# Patient Record
Sex: Female | Born: 1995 | Race: Black or African American | Hispanic: No | Marital: Single | Smoking: Former smoker
Health system: Southern US, Community
[De-identification: ages and names within clinical notes are randomized; demographics above are authoritative.]

## PROBLEM LIST (undated history)

## (undated) ENCOUNTER — Inpatient Hospital Stay (HOSPITAL_COMMUNITY): Payer: Self-pay

## (undated) DIAGNOSIS — F419 Anxiety disorder, unspecified: Secondary | ICD-10-CM

## (undated) HISTORY — PX: WISDOM TOOTH EXTRACTION: SHX21

---
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
## 2014-03-21 ENCOUNTER — Encounter (HOSPITAL_COMMUNITY): Payer: Self-pay | Admitting: Emergency Medicine

## 2014-03-21 ENCOUNTER — Emergency Department (HOSPITAL_COMMUNITY)
Admission: EM | Admit: 2014-03-21 | Discharge: 2014-03-21 | Disposition: A | Discharge status: Home / Self Care | Payer: BC Managed Care – PPO | Attending: Emergency Medicine | Admitting: Emergency Medicine

## 2014-03-21 DIAGNOSIS — F410 Panic disorder [episodic paroxysmal anxiety] without agoraphobia: Secondary | ICD-10-CM | POA: Diagnosis present

## 2014-03-21 DIAGNOSIS — Z79899 Other long term (current) drug therapy: Secondary | ICD-10-CM | POA: Diagnosis not present

## 2014-03-21 DIAGNOSIS — R42 Dizziness and giddiness: Secondary | ICD-10-CM | POA: Insufficient documentation

## 2014-03-21 DIAGNOSIS — F17200 Nicotine dependence, unspecified, uncomplicated: Secondary | ICD-10-CM | POA: Insufficient documentation

## 2014-03-21 DIAGNOSIS — R110 Nausea: Secondary | ICD-10-CM | POA: Diagnosis not present

## 2014-03-21 DIAGNOSIS — F430 Acute stress reaction: Secondary | ICD-10-CM

## 2014-03-21 MED ORDER — ALPRAZOLAM 0.25 MG PO TABS: 0.2500 mg | ORAL_TABLET | Freq: Two times a day (BID) | ORAL | Status: DC | PRN

## 2014-03-21 NOTE — ED Notes (Signed)
Social worker at bedside.

## 2014-03-21 NOTE — Discharge Instructions (Signed)
Read the information below.  Use the prescribed medication as directed.  Please discuss all new medications with your pharmacist.  You may return to the Emergency Department at any time for worsening condition or any new symptoms that concern you.   If you develop worsening chest pain, shortness of breath, fever, you pass out, or become weak or dizzy, return to the ER for a recheck.    Panic Attacks Panic attacks are sudden, short-livedsurges of severe anxiety, fear, or discomfort. They may occur for no reason when you are relaxed, when you are anxious, or when you are sleeping. Panic attacks may occur for a number of reasons:   Healthy people occasionally have panic attacks in extreme, life-threatening situations, such as war or natural disasters. Normal anxiety is a protective mechanism of the body that helps us react to danger (fight or flight response).  Panic attacks are often seen with anxiety disorders, such as panic disorder, social anxiety disorder, generalized anxiety disorder, and phobias. Anxiety disorders cause excessive or uncontrollable anxiety. They may interfere with your relationships or other life activities.  Panic attacks are sometimes seen with other mental illnesses such as depression and posttraumatic stress disorder.  Certain medical conditions, prescription medicines, and drugs of abuse can cause panic attacks. SYMPTOMS  Panic attacks start suddenly, peak within 20 minutes, and are accompanied by four or more of the following symptoms:  Pounding heart or fast heart rate (palpitations).  Sweating.  Trembling or shaking.  Shortness of breath or feeling smothered.  Feeling choked.  Chest pain or discomfort.  Nausea or strange feeling in your stomach.  Dizziness, lightheadedness, or feeling like you will faint.  Chills or hot flushes.  Numbness or tingling in your lips or hands and feet.  Feeling that things are not real or feeling that you are not  yourself.  Fear of losing control or going crazy.  Fear of dying. Some of these symptoms can mimic serious medical conditions. For example, you may think you are having a heart attack. Although panic attacks can be very scary, they are not life threatening. DIAGNOSIS  Panic attacks are diagnosed through an assessment by your health care provider. Your health care provider will ask questions about your symptoms, such as where and when they occurred. Your health care provider will also ask about your medical history and use of alcohol and drugs, including prescription medicines. Your health care provider may order blood tests or other studies to rule out a serious medical condition. Your health care provider may refer you to a mental health professional for further evaluation. TREATMENT   Most healthy people who have one or two panic attacks in an extreme, life-threatening situation will not require treatment.  The treatment for panic attacks associated with anxiety disorders or other mental illness typically involves counseling with a mental health professional, medicine, or a combination of both. Your health care provider will help determine what treatment is best for you.  Panic attacks due to physical illness usually goes away with treatment of the illness. If prescription medicine is causing panic attacks, talk with your health care provider about stopping the medicine, decreasing the dose, or substituting another medicine.  Panic attacks due to alcohol or drug abuse goes away with abstinence. Some adults need professional help in order to stop drinking or using drugs. HOME CARE INSTRUCTIONS   Take all your medicines as prescribed.   Check with your health care provider before starting new prescription or over-the-counter medicines.  Keep  all follow up appointments with your health care provider. SEEK MEDICAL CARE IF:  You are not able to take your medicines as prescribed.  Your  symptoms do not improve or get worse. SEEK IMMEDIATE MEDICAL CARE IF:   You experience panic attack symptoms that are different than your usual symptoms.  You have serious thoughts about hurting yourself or others.  You are taking medicine for panic attacks and have a serious side effect. MAKE SURE YOU:  Understand these instructions.  Will watch your condition.  Will get help right away if you are not doing well or get worse. Document Released: 09/22/2005 Document Revised: 07/13/2013 Document Reviewed: 05/06/2013 [facility] Patient Information 2014 [place], [place].  Stress Management Stress is a state of physical or mental tension that often results from changes in your life or normal routine. Some common causes of stress are:  Death of a loved one.  Injuries or severe illnesses.  Getting fired or changing jobs.  Moving into a new home. Other causes may be:  Sexual problems.  Business or financial losses.  Taking on a large debt.  Regular conflict with someone at home or at work.  Constant tiredness from lack of sleep. It is not just bad things that are stressful. It may be stressful to:  Win the lottery.  Get married.  Buy a new car. The amount of stress that can be easily tolerated varies from person to person. Changes generally cause stress, regardless of the types of change. Too much stress can affect your health. It may lead to physical or emotional problems. Too little stress (boredom) may also become stressful. SUGGESTIONS TO REDUCE STRESS:  Talk things over with your family and friends. It often is helpful to share your concerns and worries. If you feel your problem is serious, you may want to get help from a professional counselor.  Consider your problems one at a time instead of lumping them all together. Trying to take care of everything at once may seem impossible. List all the things you need to do and then start with the most important one. Set a goal  to accomplish 2 or 3 things each day. If you expect to do too many in a single day you will naturally fail, causing you to feel even more stressed.  Do not use alcohol or drugs to relieve stress. Although you may feel better for a short time, they do not remove the problems that caused the stress. They can also be habit forming.  Exercise regularly - at least 3 times per week. Physical exercise can help to relieve that "uptight" feeling and will relax you.  The shortest distance between despair and hope is often a good night's sleep.  Go to bed and get up on time allowing yourself time for appointments without being rushed.  Take a short "time-out" period from any stressful situation that occurs during the day. Close your eyes and take some deep breaths. Starting with the muscles in your face, tense them, hold it for a few seconds, then relax. Repeat this with the muscles in your neck, shoulders, hand, stomach, back and legs.  Take good care of yourself. Eat a balanced diet and get plenty of rest.  Schedule time for having fun. Take a break from your daily routine to relax. HOME CARE INSTRUCTIONS   Call if you feel overwhelmed by your problems and feel you can no longer manage them on your own.  Return immediately if you feel like hurting yourself  or someone else. Document Released: 03/18/2001 Document Revised: 12/15/2011 Document Reviewed: 05/17/2013 [facility] Patient Information 2014 [place], [place].   Emergency Department Resource Guide 1) Find a Doctor and Pay Out of Pocket Although you won't have to find out who is covered by your insurance plan, it is a good idea to ask around and get recommendations. You will then need to call the office and see if the doctor you have chosen will accept you as a new patient and what types of options they offer for patients who are self-pay. Some doctors offer discounts or will set up payment plans for their patients who do not have insurance, but you  will need to ask so you aren't surprised when you get to your appointment.  2) Contact Your Local Health Department Not all health departments have doctors that can see patients for sick visits, but many do, so it is worth a call to see if yours does. If you don't know where your local health department is, you can check in your phone book. The CDC also has a tool to help you locate your state's health department, and many state websites also have listings of all of their local health departments.  3) Find a Walk-in Clinic If your illness is not likely to be very severe or complicated, you may want to try a walk in clinic. These are popping up all over the country in pharmacies, drugstores, and shopping centers. They're usually staffed by nurse practitioners or physician assistants that have been trained to treat common illnesses and complaints. They're usually fairly quick and inexpensive. However, if you have serious medical issues or chronic medical problems, these are probably not your best option.  No Primary Care Doctor: - Call Health Connect at  [phone] - they can help you locate a primary care doctor that  accepts your insurance, provides certain services, etc. - Physician Referral Service- [phone]  Chronic Pain Problems: Organization         Address  Phone   Notes  [facility]  [phone] Patients need to be referred by their primary care doctor.   Medication Assistance: Organization         Address  Phone   Notes  [facility] Medication [facility] [address] [place] [address] [place], [place] [address] [phone] --Must be a resident of [facility] -- Must have NO insurance coverage whatsoever (no Medicaid/ Medicare, etc.) -- The pt. MUST have a primary care doctor that directs their care regularly and follows them in the community   MedAssist  [phone]   [employer]  [phone]    Agencies that provide inexpensive medical  care: Organization         Address  Phone   Notes  [facility]  [phone]   [facility]    [phone]   [facility] [address] [place], [place] [address] [phone]   [facility] [address], [place] [phone]   Planned Parenthood    [phone]   [facility]    [phone]   [facility]  [address], [place] Phone:  [phone], Fax:  [phone] Hours of Operation:  9 am - 6 pm, M-F.  Also accepts Medicaid/Medicare and self-pay.  [facility]  [address], [place] Phone: [phone], Fax: [phone].  Hours of Operation:  8:30 am - 5:30 pm, M-F.  Also accepts Medicaid and self-pay.  [facility] [place] [address], [place] Phone: [phone]   [facility] [address] Natasha Bence [place], [place] [phone], Ext. [phone] Mondays & Thursdays: 7-9 AM.  First 15 patients are seen on a first come, first serve basis.    Medicaid-accepting [facility] Providers:  Organization         Address  Phone   Notes  [facility] [address], [place] [phone] Also accepts self-pay patients.  [facility] [address] Laurell Josephs Basir [place], [place]  [phone]   [facility] [address], [place] [phone]   [facility] Family [facility] [address], [place] [phone]   Renaye Rakers [address], [place]   [phone] Only accepts [place] patients after they have their name applied to their card.   Self-Pay (no insurance) in [facility]:  Organization         Address  Phone   Notes  Sickle Cell Patients, [facility] Internal Medicine [address] [place], [place] [phone]   [facility] Urgent Care [address] [place],  [place] [phone]   [facility] [place]  [address], [place] [phone]   [facility]/Dr. Osei-Bonsu  [address], [place] or [address], [place] [phone] Phone number for both [place] and [place] locations is the same.  [facility] [address], [place] [phone]   [facility] [address] [place] or [address] 989 [phone]   [facility] [address] [place] [phone], phone; [phone], fax Sees patients 1st and 3rd Saturday of every month.  Must not qualify for public or private insurance (i.e. Medicaid, Medicare, [place] Health Choice, Veterans' Benefits)  Household income should be no more than 200% of the poverty level The clinic cannot treat you if you are pregnant or think you are pregnant  Sexually transmitted diseases are not treated at the clinic.    Dental Care: Organization         Address  Phone  Notes  [facility] Department of [facility] [address] [place], [place] [phone] Accepts children up to age 69 who are enrolled in [place] or [place] Health Choice; pregnant women with a Medicaid card; and children who have applied for Medicaid or [place] Health Choice, but were declined, whose parents can pay a reduced fee at time of service.  [facility] Department of [facility]  [address], [place] [phone] Accepts children up to age 41 who are enrolled in [place] or [place] Health Choice; pregnant women with a Medicaid card; and children who have applied for Medicaid or  Health Choice, but were declined, whose parents can pay a reduced fee at time of service.  [facility]  [address] [place], [place] [phone] Patients are seen by appointment only. Walk-ins are not accepted. [facility] will see patients 50 years of age and older. Monday - Tuesday (8am-5pm) Most Wednesdays (8:30-5pm) $30 per visit, cash only  [facility] Adult [employer] PROGRAM  [address], [facility] [phone] Patients  are seen by appointment only. Walk-ins are not accepted. [facility] will see patients 13 years of age and older. One Wednesday Evening (Monthly: Volunteer Based).  $30 per visit, cash only  [employer]  [phone] for adults; Children under age 92, call Graduate Pediatric Dentistry at [phone]. Children aged 66-14, please call [phone] to request a pediatric application.  Dental services are provided in all areas of dental care including fillings, crowns and bridges, complete and partial dentures, implants, gum treatment, root canals, and extractions. Preventive care is also provided. Treatment is provided to both adults and children. Patients are selected via a lottery and there is often a waiting list.   [facility] [address], [place]  [phone] [email or web address]   [facility] [address] [place], [place] [phone], Ext. [phone] Second and Fourth Thursday of each month, opens at 6:30 AM; Clinic ends at 9 AM.  Patients are seen on a first-come first-served basis, and a limited number are seen during each clinic.   [facility]  [address] Ether Griffins [place], [place] [phone]   Eligibility Requirements You must have lived in [place], [place], or [place] counties for at least the last three months.   You cannot be eligible for state or federal sponsored [place], including [employer], [place], or [employer].   You generally cannot be eligible for healthcare insurance through your employer.    How to apply: Eligibility screenings are held every Tuesday and Wednesday afternoon from 1:00 pm until 4:00 pm. You do not need an appointment for the interview!   [facility] [address], [place], [place] [phone]   [facility] Health Department  [phone]   [facility] Health Department  [phone]   [facility] Health Department  [phone]    Behavioral Health Resources in the Community: Intensive Outpatient Programs Organization         Address  Phone  Notes  [facility] [address] [place], [place] [phone]   [facility] Outpatient [address], [place], [place] [phone]   [facility] [address], [place], [place]  [phone]   [facility] Mental Health [address],  [place], [place] [phone] or [phone]   Substance Abuse Resources Organization         Address  Phone  Notes  [facility]  [phone]   [facility]  [phone]   [facility] [place]  [phone]   Floydene Flock  [phone]   Residential & Outpatient Substance Abuse Program  [phone]   Psychological Services Organization         Address  Phone  Notes  [facility] Behavioral Health  [phone]   [facility]  [phone]   [facility] Mental Health [address] [place] [phone] or [phone]    Mobile Crisis Teams Organization         Address  Phone  Notes  [facility], Mobile Crisis Care Unit  [phone]   [facility]  [address] [place], [place] [phone]   Doristine Locks [address] [place] [phone]    Self-Help/Support Groups Organization         Address  Phone             Notes  [facility] [place] - variety of support groups  [phone]- I7437963 Call for  more information  Narcotics Anonymous (NA), [facility] [address], [place]  2 meetings at this location   Residential Sports administratorTreatment Programs Organization         Address  Phone  Notes  [facility] [address],    [place]  [phone]   [facility]  [address], [place] [phone], [place], [place] [phone]   [facility] [address], [place] [phone] Admissions: 8am-3pm M-F  [facility] [address]    [place], [place] [phone]   [facility] [address], [place] [phone]   [facility] [address]  [place], [place] [phone]   [facility] - Intensive Outpatient [address], [place], [place] [phone]   [facility] ([facility] [address]  [place], [place] [phone] or [phone]   [facility] ([facility]) [address] [place], [place] [phone] Accepts Medicaid  [facility] [address]  [place] [phone] Substance Abuse/Addiction Treatment   [facility] Behavioral Health Resources Organization         Address  Phone  Notes  [facility]  [phone]) [phone]   Angie FavaJulie Brannon, PhD [address] [place], [place]   [phone](336) [phone] or [phone]) [phone]   [facility]   [address] [place], [place] [phone]   [facility] [address], [place], [place] [phone] Insurance/Medicaid/sponsorship through [facility]  [facility] [address]                                    [place], [place] [phone] Therapy/tele-psych/case  [facility] [address].   [place], [place] [phone]    Dr. Lolly MustacheArfeen  [phone](336) [phone]   [facility]  [employer] [facility] 1) [address], [place] 2) [address], [place] 3)  [address], [place] [phone](336) [phone]  [phone]   [facility] Child Abuse Hotline [phone](336) [phone] or [phone](336) [phone] (After Hours)

## 2014-03-21 NOTE — Progress Notes (Signed)
CSW consult to pt regarding anxiety attack. Pt reported that this is the second attack she has experienced in the last 2 months.Pt reported that she has experienced panic attacks due to overwhelming circumstances in her life. Pt reported that her brother was killed 3 years ago in a violent attack and pt's friend was killed 2 days ago in a violent manner. Pt reported that she has been in grief counseling in connection with the death of her brother but felt it was not helpful. CSW shared with pt the stages of grief and shared coping mechanisms to combat anxiety. Pt voiced understanding. Pt agreed that she needs counseling to work through grief and anxiety. CSW gave pt resources for outpt counseling. Pt given resource for Mobile Crisis Unit.  Pt appreciative of CSW interaction. No further intervention needed. Pt to be discharged.   [address]Doris Best, [place] [phone]

## 2014-03-21 NOTE — ED Provider Notes (Signed)
CSN: [id number]     Arrival date & time 03/21/14  1157 History   First MD Initiated Contact with Patient 03/21/14 1211     Chief Complaint  Patient presents with  . Panic Attack     (Consider location/radiation/quality/duration/timing/severity/associated sxs/prior Treatment) The history is provided by the patient.    Patient presents with episode with anxiety, tachypnea, fingers tingling, chest tightness, lightheadedness, nausea, SOB that occurred while having a fight with her mother.  She was very upset this morning, thinking about her brother who was killed 3 years ago and her friend who was killed 2 days ago.  She has never been able to talk with her mother about her brother's death and feels that it is important to her.  She occasionally has episodes similar to this during which she gets very short of breath during or after a stressful conversation or argument.  She has also awoken from sleep feeling SOB.  This lasts 10-15 minutes.   States she currently feels fine.  The symptoms improved with getting away from her mother Verdia Kuba/removing herself from the emotional situation, are worsened when involved in or thinking about these emotional situations.  Has seen a counselor following her brother's death but she feels it did not help.  She does have a pediatrician she can see and feels she can talk to.  Also has a close friend who is a support person for her.   Denies recent immobilization, personal or family hx blood clots, leg swelling.   She is on birth control.    History reviewed. No pertinent past medical history. History reviewed. No pertinent past surgical history. Family History  Problem Relation Age of Onset  . Hypertension Mother   . Hypertension Father   . Diabetes Paternal Grandmother    History  Substance Use Topics  . Smoking status: Current Every Day Smoker -- 0.10 packs/day for 1 years    Types: Cigars  . Smokeless tobacco: Not on file  . Alcohol Use: No   OB History   Grav Para Term Preterm Abortions TAB SAB Ect Mult Living                 Review of Systems  All other systems reviewed and are negative.     Allergies  Review of patient's allergies indicates no known allergies.  Home Medications   Prior to Admission medications   Medication Sig Start Date End Date Taking? Authorizing Provider  loratadine (CLARITIN) 10 MG tablet Take 10 mg by mouth daily as needed for allergies.    Yes Historical Provider, MD  norgestimate-ethinyl estradiol (ORTHO-CYCLEN,SPRINTEC,PREVIFEM) 0.25-35 MG-MCG tablet Take 1 tablet by mouth daily.   Yes Historical Provider, MD  triamcinolone cream (KENALOG) 0.1 % Apply 1 application topically daily as needed (for eczema).   Yes Historical Provider, MD   BP 111/76  Pulse 58  Temp(Src) 98 F (36.7 C) (Oral)  Resp 18  Ht 5\' 3"  (1.6 m)  Wt 110 lb (49.896 kg)  BMI 19.49 kg/m2  SpO2 100% Physical Exam  Nursing note and vitals reviewed. Constitutional: She appears well-developed and well-nourished. No distress.  HENT:  Head: Normocephalic and atraumatic.  Neck: Neck supple.  Cardiovascular: Normal rate and regular rhythm.   Pulmonary/Chest: Effort normal and breath sounds normal. No respiratory distress. She has no wheezes. She has no rales.  Abdominal: Soft. She exhibits no distension. There is no tenderness. There is no rebound and no guarding.  Musculoskeletal: She exhibits no edema.  Neurological: She  is alert.  Skin: She is not diaphoretic.    ED Course  Procedures (including critical care time) Labs Review Labs Reviewed - No data to display  Imaging Review No results found.   EKG Interpretation None      12:31 PM Pt seen and examined.  Declined anxiety medication at this time.  Declined social work involvement.    1:25 PM Pt seen by Tyler Aasoris, Child psychotherapistsocial worker, who will arrange counseling for the patient.   MDM   Final diagnoses:  Panic attack as reaction to stress    Pt with constellation of  symptoms that occurred while fighting with her mother and being upset about recent family and friend deaths c/w panic attack.  Pt feels better now as she is removed from the situation.  This has happened to her in the past.  She is on birth control but otherwise has no risk factors for PE and clinically I doubt PE.  Seen by social worker who has arranged counseling follow up for the patient.  Pt d/c home with #10 low dose (0.25mg ) xanax and recommended close counselor and pediatrician follow up.  Discussed findings, treatment, and follow up  with patient.  Pt given return precautions.  Pt verbalizes understanding and agrees with plan.        St. RegisEmily Deah Ottaway, PA-C 03/21/14 [phone]

## 2014-03-21 NOTE — ED Notes (Signed)
Patient at home thinking of brother who passed away 3 year ago and a friend 2 days ago.  Argued with Mother and started to cry.  After crying breathing fast and couldn't catch her breath. During transport patient breathing improved and stopped crying.  Upon arrival patient alert answering and following commands appropriate. Calm cooperative.

## 2014-03-22 NOTE — ED Provider Notes (Signed)
Medical screening examination/treatment/procedure(s) were performed by non-physician practitioner and as supervising physician I was immediately available for consultation/collaboration.   EKG Interpretation None        Nolin Grell T Helaman Mecca, MD 03/22/14 1107 

## 2014-06-14 ENCOUNTER — Other Ambulatory Visit (INDEPENDENT_AMBULATORY_CARE_PROVIDER_SITE_OTHER): Payer: BC Managed Care – PPO | Admitting: *Deleted

## 2014-06-14 VITALS — BP 117/80 | HR 61 | Temp 98.3°F | Wt 108.8 lb

## 2014-06-14 DIAGNOSIS — Z3200 Encounter for pregnancy test, result unknown: Secondary | ICD-10-CM

## 2014-06-14 DIAGNOSIS — Z3202 Encounter for pregnancy test, result negative: Secondary | ICD-10-CM

## 2014-06-14 LAB — POCT URINE PREGNANCY: PREG TEST UR: NEGATIVE

## 2014-06-14 NOTE — Progress Notes (Signed)
Patient is in the office today for a UPT. UTP preformed, results were negative. Patient states she is needing to see a Librarian, academic. Patient notified that no Doctors are in the office this afternoon, that she had only been scheduled for a nurse visit. Patient states she had sexual intercourse 3 days ago and feels like he might have ripped her. Patient states she is having irritation, vaginal lip swelling or a lump. Patient states swelling has went down since yesterday. Patient denies any burning, vaginal discharge or odor. Patient notified to make an appointment with the front to see Dr. Tamela Oddi tomorrow morning to be examined. Patient voiced understanding. Patient states she is currently taking all medications except her xanax which she has run out of. Patient states she needs a refill because she had a panic attack yesterday. Patient states she did not receive the medication from [place].   BP 117/80  Pulse 61  Temp(Src) 98.3 F (36.8 C)  Wt 108 lb 12.8 oz (49.351 kg)  LMP 05/30/2014  Results for orders placed in visit on 06/14/14 (from the past 24 hour(s))  POCT URINE PREGNANCY     Status: None   Collection Time    06/14/14  4:23 PM      Result Value Ref Range   Preg Test, Ur Negative

## 2014-06-15 ENCOUNTER — Ambulatory Visit: Payer: BC Managed Care – PPO | Admitting: Obstetrics & Gynecology

## 2014-06-21 ENCOUNTER — Ambulatory Visit: Payer: BC Managed Care – PPO | Admitting: Obstetrics & Gynecology

## 2014-07-06 ENCOUNTER — Other Ambulatory Visit (INDEPENDENT_AMBULATORY_CARE_PROVIDER_SITE_OTHER): Payer: BC Managed Care – PPO

## 2014-07-06 VITALS — BP 129/85 | HR 101 | Temp 97.7°F | Ht 63.0 in | Wt 106.0 lb

## 2014-07-06 DIAGNOSIS — Z3200 Encounter for pregnancy test, result unknown: Secondary | ICD-10-CM

## 2014-07-06 LAB — POCT URINE PREGNANCY: PREG TEST UR: POSITIVE

## 2014-07-06 NOTE — Progress Notes (Signed)
Patient in office today for a pregnancy test. Patient states she had 3 positive home pregnancy test. Patient states she has currently been on birth control and denies missing any pills. Pregnancy Test in office is positive. Patient states this is unintended pregnancy but she does intend to keep the pregnancy. Patient encouraged to schedule a NOB appointment. Patient encouraged to start prenatal vitamins.   BP 129/85  Pulse 101  Temp(Src) 97.7 F (36.5 C)  Ht 5\' 3"  (1.6 m)  Wt 106 lb (48.081 kg)  BMI 18.78 kg/m2  LMP 05/30/2014

## 2014-07-08 ENCOUNTER — Encounter (HOSPITAL_COMMUNITY): Payer: Self-pay

## 2014-07-08 ENCOUNTER — Inpatient Hospital Stay (HOSPITAL_COMMUNITY)
Admission: AD | Admit: 2014-07-08 | Discharge: 2014-07-08 | Disposition: A | Discharge status: Home / Self Care | Payer: BC Managed Care – PPO | Source: Ambulatory Visit | Attending: Obstetrics & Gynecology | Admitting: Obstetrics & Gynecology

## 2014-07-08 DIAGNOSIS — Z87891 Personal history of nicotine dependence: Secondary | ICD-10-CM | POA: Diagnosis not present

## 2014-07-08 DIAGNOSIS — R109 Unspecified abdominal pain: Secondary | ICD-10-CM | POA: Diagnosis present

## 2014-07-08 DIAGNOSIS — O26899 Other specified pregnancy related conditions, unspecified trimester: Secondary | ICD-10-CM

## 2014-07-08 DIAGNOSIS — O9989 Other specified diseases and conditions complicating pregnancy, childbirth and the puerperium: Secondary | ICD-10-CM

## 2014-07-08 LAB — URINALYSIS, ROUTINE W REFLEX MICROSCOPIC
Glucose, UA: NEGATIVE mg/dL
Hgb urine dipstick: NEGATIVE
KETONES UR: NEGATIVE mg/dL
Leukocytes, UA: NEGATIVE
NITRITE: NEGATIVE
Protein, ur: 100 mg/dL — AB
UROBILINOGEN UA: 4 mg/dL — AB (ref 0.0–1.0)
pH: 6 (ref 5.0–8.0)

## 2014-07-08 LAB — URINE MICROSCOPIC-ADD ON

## 2014-07-08 MED ORDER — PRENATAL PLUS 27-1 MG PO TABS: 1.0000 | ORAL_TABLET | Freq: Every day | ORAL | Status: DC

## 2014-07-08 NOTE — MAU Provider Note (Signed)
History     CSN: [id number]  Arrival date and time: 07/08/14 1127   First Provider Initiated Contact with Patient 07/08/14 1227      Chief Complaint  Patient presents with  . Abdominal Pain   HPI Suzanne Goodwin 18 y.o. [redacted]w[redacted]d  Comes to MAU with upper abdominal pain today when she awakened, but is now gone.  Drove by the office this morning, but the office was closed, so she came to MAU.  Is very worried she might have a miscarriage.  Is not having lower abdominal pain or cramping.  Has not yet eaten today.  Has an appointment in the office in early November to begin prenatal care.  Significant history of panic attacks from previous medical visits noted in her chart.  OB History   Grav Para Term Preterm Abortions TAB SAB Ect Mult Living   1 0 0 0 0 0 0 0 0 0       Past Medical History  Diagnosis Date  . Medical history non-contributory     Past Surgical History  Procedure Laterality Date  . No past surgeries      Family History  Problem Relation Age of Onset  . Hypertension Mother   . Hypertension Father   . Diabetes Paternal Grandmother     History  Substance Use Topics  . Smoking status: Former Smoker -- 0.10 packs/day for 1 years    Types: Cigars  . Smokeless tobacco: Never Used  . Alcohol Use: No    Allergies: No Known Allergies  No prescriptions prior to admission    Review of Systems  Constitutional: Negative for fever.  Gastrointestinal: Positive for abdominal pain. Negative for nausea, vomiting, diarrhea and constipation.  Genitourinary:       No vaginal discharge. No vaginal bleeding. No dysuria.   Physical Exam   Blood pressure 116/78, pulse 87, temperature 98.5 F (36.9 C), temperature source Oral, resp. rate 18, last menstrual period 05/30/2014.  Physical Exam  Nursing note and vitals reviewed. Constitutional: She is oriented to person, place, and time. She appears well-developed and well-nourished.  HENT:  Head: Normocephalic.  Eyes: EOM  are normal.  Neck: Neck supple.  GI: Soft. There is no tenderness.  No pain in lower abdomen bilaterally.  Has had pain in upper abdomen all across just under ribs.  Musculoskeletal: Normal range of motion.  Neurological: She is alert and oriented to person, place, and time.  Skin: Skin is warm and dry.  Psychiatric: She has a normal mood and affect.    MAU Course  Procedures Results for orders placed during the hospital encounter of 07/08/14 (from the past 24 hour(s))  URINALYSIS, ROUTINE W REFLEX MICROSCOPIC     Status: Abnormal   Collection Time    07/08/14 11:37 AM      Result Value Ref Range   Color, Urine YELLOW  YELLOW   APPearance HAZY (*) CLEAR   Specific Gravity, Urine >1.030 (*) 1.005 - 1.030   pH 6.0  5.0 - 8.0   Glucose, UA NEGATIVE  NEGATIVE mg/dL   Hgb urine dipstick NEGATIVE  NEGATIVE   Bilirubin Urine SMALL (*) NEGATIVE   Ketones, ur NEGATIVE  NEGATIVE mg/dL   Protein, ur 119 (*) NEGATIVE mg/dL   Urobilinogen, UA 4.0 (*) 0.0 - 1.0 mg/dL   Nitrite NEGATIVE  NEGATIVE   Leukocytes, UA NEGATIVE  NEGATIVE  URINE MICROSCOPIC-ADD ON     Status: Abnormal   Collection Time    07/08/14 11:37 AM  Result Value Ref Range   Squamous Epithelial / LPF MANY (*) RARE   WBC, UA 0-2  <3 WBC/hpf   RBC / HPF 0-2  <3 RBC/hpf   Bacteria, UA MANY (*) RARE   Urine-Other MUCOUS PRESENT      MDM Reviewed early pregnancy instructions.  Will prescribe prenatal vitamins. Since pain is currently better and client seems very well, no further evaluation done today.  Assessment and Plan  Abdominal pain in early pregnancy  Plan Advised to eat upon awakening and eat small frequent meals every 3 hours in early pregnancy. Keep your appointment in the office. Take Tylenol 325 mg 2 tablets by mouth every 4 hours if needed for pain. Drink at least 8 8-oz glasses of water every day. Call your doctor if you have questions or concerns. Get your prenatal vitamins and take one by mouth  daily.    Meklit Cotta 07/08/2014, 12:35 PM

## 2014-07-08 NOTE — Discharge Instructions (Signed)
Eat upon awakening and eat small frequent meals every 3 hours in early pregnancy. Keep your appointment in the office. Take Tylenol 325 mg 2 tablets by mouth every 4 hours if needed for pain. Drink at least 8 8-oz glasses of water every day. Call your doctor if you have questions or concerns. Get your prenatal vitamins and take one by mouth daily.

## 2014-07-08 NOTE — MAU Note (Signed)
Pt presents complaining of pain in her upper abdomen. Denies vaginal bleeding or discharge. [facility]

## 2014-07-19 ENCOUNTER — Inpatient Hospital Stay (HOSPITAL_COMMUNITY)
Admission: AD | Admit: 2014-07-19 | Discharge: 2014-07-19 | Disposition: A | Discharge status: Home / Self Care | Payer: BC Managed Care – PPO | Source: Ambulatory Visit | Attending: Obstetrics | Admitting: Obstetrics

## 2014-07-19 ENCOUNTER — Encounter (HOSPITAL_COMMUNITY): Payer: Self-pay | Admitting: *Deleted

## 2014-07-19 DIAGNOSIS — O9989 Other specified diseases and conditions complicating pregnancy, childbirth and the puerperium: Secondary | ICD-10-CM | POA: Diagnosis not present

## 2014-07-19 DIAGNOSIS — R1030 Lower abdominal pain, unspecified: Secondary | ICD-10-CM | POA: Diagnosis present

## 2014-07-19 LAB — URINALYSIS, ROUTINE W REFLEX MICROSCOPIC
Bilirubin Urine: NEGATIVE
GLUCOSE, UA: NEGATIVE mg/dL
Hgb urine dipstick: NEGATIVE
Ketones, ur: NEGATIVE mg/dL
Nitrite: NEGATIVE
PH: 6 (ref 5.0–8.0)
Protein, ur: NEGATIVE mg/dL
SPECIFIC GRAVITY, URINE: 1.02 (ref 1.005–1.030)
Urobilinogen, UA: 0.2 mg/dL (ref 0.0–1.0)

## 2014-07-19 LAB — URINE MICROSCOPIC-ADD ON

## 2014-07-19 NOTE — MAU Note (Signed)
PT  SAYS SHE WENT TO DR HARPER'S OFFICE  ON 10-1-   POSITIVE UPT-   NEXT APPOINTMENT    NOV.        SAYS AT 10AM-  WHEN SHE AWOKE - SHE HAD LOWER ABD PAIN-    UPPER  BODY STARTED HURTING AT 10AM.    DID NOT CALL OFFICE.      DENIES PAIN WHEN VOIDS.     FEELS CRAMPING.   NO BLEEDING.   LAST SEX- SUN

## 2014-07-19 NOTE — MAU Note (Signed)
I EXPLAINED  PLAN-  AND PT SAID  SHE HAD TO LEAVE-   WAS GOING TO GET  SOMETHING TO EAT AND SLEEP-  BECAUSE  SHE HAS CLASS  AT 0700.

## 2014-08-07 ENCOUNTER — Encounter (HOSPITAL_COMMUNITY): Payer: Self-pay | Admitting: *Deleted

## 2014-08-07 ENCOUNTER — Inpatient Hospital Stay (HOSPITAL_COMMUNITY)
Admission: AD | Admit: 2014-08-07 | Discharge: 2014-08-07 | Disposition: A | Discharge status: Home / Self Care | Payer: BC Managed Care – PPO | Source: Ambulatory Visit | Attending: Obstetrics & Gynecology | Admitting: Obstetrics & Gynecology

## 2014-08-07 DIAGNOSIS — Z87891 Personal history of nicotine dependence: Secondary | ICD-10-CM | POA: Insufficient documentation

## 2014-08-07 DIAGNOSIS — Z3A09 9 weeks gestation of pregnancy: Secondary | ICD-10-CM | POA: Insufficient documentation

## 2014-08-07 DIAGNOSIS — O210 Mild hyperemesis gravidarum: Secondary | ICD-10-CM | POA: Insufficient documentation

## 2014-08-07 DIAGNOSIS — O219 Vomiting of pregnancy, unspecified: Secondary | ICD-10-CM

## 2014-08-07 LAB — URINALYSIS, ROUTINE W REFLEX MICROSCOPIC
BILIRUBIN URINE: NEGATIVE
Glucose, UA: NEGATIVE mg/dL
HGB URINE DIPSTICK: NEGATIVE
Ketones, ur: NEGATIVE mg/dL
Nitrite: NEGATIVE
PROTEIN: NEGATIVE mg/dL
Specific Gravity, Urine: >1.03 — ABNORMAL HIGH (ref 1.005–1.030)
Urobilinogen, UA: 0.2 mg/dL (ref 0.0–1.0)
pH: 6 (ref 5.0–8.0)

## 2014-08-07 LAB — URINE MICROSCOPIC-ADD ON

## 2014-08-07 MED ORDER — LACTATED RINGERS IV SOLN
25.0000 mg | Freq: Once | INTRAVENOUS | Status: AC
  Administered 2014-08-07: 25 mg via INTRAVENOUS
  Filled 2014-08-07: qty 1

## 2014-08-07 MED ORDER — PROMETHAZINE HCL 12.5 MG PO TABS: 12.5000 mg | ORAL_TABLET | Freq: Four times a day (QID) | ORAL | Status: DC | PRN

## 2014-08-07 NOTE — Discharge Instructions (Signed)
Morning Sickness °Morning sickness is when you feel sick to your stomach (nauseous) during pregnancy. You may feel sick to your stomach and throw up (vomit). You may feel sick in the morning, but you can feel this way any time of day. Some women feel very sick to their stomach and cannot stop throwing up (hyperemesis gravidarum). °HOME CARE °· Only take medicines as told by your doctor. °· Take multivitamins as told by your doctor. Taking multivitamins before getting pregnant can stop or lessen the harshness of morning sickness. °· Eat dry toast or unsalted crackers before getting out of bed. °· Eat 5 to 6 small meals a day. °· Eat dry and bland foods like rice and baked potatoes. °· Do not drink liquids with meals. Drink between meals. °· Do not eat greasy, fatty, or spicy foods. °· Have someone cook for you if the smell of food causes you to feel sick or throw up. °· If you feel sick to your stomach after taking prenatal vitamins, take them at night or with a snack. °· Eat protein when you need a snack (nuts, yogurt, cheese). °· Eat unsweetened gelatins for dessert. °· Wear a bracelet used for sea sickness (acupressure wristband). °· Go to a doctor that puts thin needles into certain body points (acupuncture) to improve how you feel. °· Do not smoke. °· Use a humidifier to keep the air in your house free of odors. °· Get lots of fresh air. °GET HELP IF: °· You need medicine to feel better. °· You feel dizzy or lightheaded. °· You are losing weight. °GET HELP RIGHT AWAY IF:  °· You feel very sick to your stomach and cannot stop throwing up. °· You pass out (faint). °MAKE SURE YOU: °· Understand these instructions. °· Will watch your condition. °· Will get help right away if you are not doing well or get worse. °Document Released: 10/30/2004 Document Revised: 09/27/2013 Document Reviewed: 03/09/2013 °ExitCare® Patient Information ©2015 [employer]. This information is not intended to replace advice given to you by  your health care provider. Make sure you discuss any questions you have with your health care provider. ° °Eating Plan for Hyperemesis Gravidarum °Severe cases of hyperemesis gravidarum can lead to dehydration and malnutrition. The hyperemesis eating plan is one way to lessen the symptoms of nausea and vomiting. It is often used with prescribed medicines to control your symptoms.  °WHAT CAN I DO TO RELIEVE MY SYMPTOMS? °Listen to your body. Everyone is different and has different preferences. Find what works best for you. Some of the following things may help: °· Eat and drink slowly. °· Eat 5-6 small meals daily instead of 3 large meals.   °· Eat crackers before you get out of bed in the morning.   °· Starchy foods are usually well tolerated (such as cereal, toast, bread, potatoes, pasta, rice, and pretzels).   °· Ginger may help with nausea. Add ¼ tsp ground ginger to hot tea or choose ginger tea.   °· Try drinking 100% fruit juice or an electrolyte drink. °· Continue to take your prenatal vitamins as directed by your health care provider. If you are having trouble taking your prenatal vitamins, talk with your health care provider about different options. °· Include at least 1 serving of protein with your meals and snacks (such as meats or poultry, beans, nuts, eggs, or yogurt). Try eating a protein-rich snack before bed (such as cheese and crackers or a half turkey or peanut butter sandwich). °WHAT THINGS SHOULD I   AVOID TO REDUCE MY SYMPTOMS? °The following things may help reduce your symptoms: °· Avoid foods with strong smells. Try eating meals in well-ventilated areas that are free of odors. °· Avoid drinking water or other beverages with meals. Try not to drink anything less than 30 minutes before and after meals. °· Avoid drinking more than 1 cup of fluid at a time. °· Avoid fried or high-fat foods, such as butter and cream sauces. °· Avoid spicy foods. °· Avoid skipping meals the best you can. Nausea can be  more intense on an empty stomach. If you cannot tolerate food at that time, do not force it. Try sucking on ice chips or other frozen items and make up the calories later. °· Avoid lying down within 2 hours after eating. °Document Released: 07/20/2007 Document Revised: 09/27/2013 Document Reviewed: 07/27/2013 °ExitCare® Patient Information ©2015 [employer]. This information is not intended to replace advice given to you by your health care provider. Make sure you discuss any questions you have with your health care provider. ° °

## 2014-08-07 NOTE — MAU Note (Signed)
Pt. was up to BR and IV got dislodged and was discontinued. Harlon FlorJ Wenzel PA aware that 600cc of fluid was infused and states that pt. May be d/c to home.

## 2014-08-07 NOTE — MAU Provider Note (Signed)
History     CSN: [id number]  Arrival date and time: 08/07/14 [id number]   First Provider Initiated Contact with Patient 08/07/14 1949      No chief complaint on file.  HPI Suzanne Goodwin is a 18 y.o. G1P0000 at 462w6d who presents to MAU today with complaint of N/V. She states that this has been present x weeks and became worse this week. She states minimal PO intake today. She also endorses heartburn and streaks of blood in her vomit tonight. She denies abdominal pain, vaginal bleeding, discharge, fever or diarrhea.   OB History    Gravida Para Term Preterm AB TAB SAB Ectopic Multiple Living   1 0 0 0 0 0 0 0 0 0       Past Medical History  Diagnosis Date  . Medical history non-contributory     Past Surgical History  Procedure Laterality Date  . No past surgeries    . Wisdom tooth extraction      Family History  Problem Relation Age of Onset  . Hypertension Mother   . Hypertension Father   . Diabetes Paternal Grandmother     History  Substance Use Topics  . Smoking status: Former Smoker -- 0.10 packs/day for 1 years    Quit date: 07/26/2014  . Smokeless tobacco: Never Used  . Alcohol Use: No    Allergies: No Known Allergies  Prescriptions prior to admission  Medication Sig Dispense Refill Last Dose  . prenatal vitamin w/FE, FA (PRENATAL 1 + 1) 27-1 MG TABS tablet Take 1 tablet by mouth daily at 12 noon. May take at another time if desired. 30 each 0 Past Month at Unknown time    Review of Systems  Constitutional: Negative for fever and malaise/fatigue.  Gastrointestinal: Positive for heartburn, nausea and vomiting. Negative for abdominal pain, diarrhea and constipation.  Genitourinary: Negative for dysuria, urgency and frequency.       Neg - vaginal bleeding, discharge   Physical Exam   Blood pressure 116/76, pulse 87, temperature 98.8 F (37.1 C), temperature source Oral, resp. rate 20, height 5\' 3"  (1.6 m), weight 107 lb 6 oz (48.705 kg), last menstrual  period 05/30/2014.  Physical Exam  Constitutional: She is oriented to person, place, and time. She appears well-developed and well-nourished. No distress.  HENT:  Head: Normocephalic.  Cardiovascular: Normal rate.   Respiratory: Effort normal.  GI: Soft. Bowel sounds are normal. She exhibits no distension and no mass. There is no tenderness. There is no rebound and no guarding.  Neurological: She is alert and oriented to person, place, and time.  Skin: Skin is warm and dry. No erythema.  Psychiatric: She has a normal mood and affect.   Results for orders placed or performed during the hospital encounter of 08/07/14 (from the past 24 hour(s))  Urinalysis, Routine w reflex microscopic     Status: Abnormal   Collection Time: 08/07/14  7:31 PM  Result Value Ref Range   Color, Urine YELLOW YELLOW   APPearance CLEAR CLEAR   Specific Gravity, Urine >1.030 (H) 1.005 - 1.030   pH 6.0 5.0 - 8.0   Glucose, UA NEGATIVE NEGATIVE mg/dL   Hgb urine dipstick NEGATIVE NEGATIVE   Bilirubin Urine NEGATIVE NEGATIVE   Ketones, ur NEGATIVE NEGATIVE mg/dL   Protein, ur NEGATIVE NEGATIVE mg/dL   Urobilinogen, UA 0.2 0.0 - 1.0 mg/dL   Nitrite NEGATIVE NEGATIVE   Leukocytes, UA SMALL (A) NEGATIVE  Urine microscopic-add on     Status: Abnormal  Collection Time: 08/07/14  7:31 PM  Result Value Ref Range   Squamous Epithelial / LPF MANY (A) RARE   WBC, UA 3-6 <3 WBC/hpf   Bacteria, UA FEW (A) RARE   Urine-Other MUCOUS PRESENT     MAU Course  Procedures None  MDM UA today shows mild signs of dehydration.  IV Phenergan 25 mg infusion in LR Patient is resting comfortably. No episodes of emesis while in MAU. Patient able to tolerate PO in MAU today.  Assessment and Plan  A: SIUP at 8219w6d Nausea and vomiting in pregnancy prior to [redacted] weeks gestation  P: Discharge home Rx for Phenergan given to patient Patient advised to follow-up with [facility] as scheduled for routine prenatal care or sooner if  symptoms worsen Patient may return to MAU as needed or if her condition were to change or worsen   Marny LowensteinJulie N Wenzel, PA-C  08/07/2014, 9:28 PM

## 2014-08-07 NOTE — MAU Note (Signed)
PT  WAS IN MAU ON 10-3-    FOR  CRAMPS.      NO CRAMPS   NOW.   SAYS SHE STARTED VOMITING 2 WEEKS   AGO.     GOT PNV IN OFFICE.      SHE CALLED  OFFICE   THIS AM  AND WAS   TOLD TO GO  TO STORE AND GET UNISOM   BUT SHE DID NOT.       SAYS  SHE HAS  VOMITED X3 TODAY.    SAW BLOOD STREAKS   AND  HAD KETCHUP    WITH FRIES.

## 2014-08-17 ENCOUNTER — Encounter (HOSPITAL_COMMUNITY): Payer: Self-pay | Admitting: *Deleted

## 2014-09-22 ENCOUNTER — Telehealth: Payer: Self-pay | Admitting: *Deleted

## 2014-09-22 NOTE — Telephone Encounter (Signed)
Attempted to contact patient to follow up on OB care. Left message for patient to contact the office.

## 2014-10-02 ENCOUNTER — Encounter: Payer: Self-pay | Admitting: *Deleted

## 2014-10-03 ENCOUNTER — Encounter: Payer: Self-pay | Admitting: Obstetrics & Gynecology

## 2014-10-06 NOTE — L&D Delivery Note (Signed)
Patient is 19 y.o. G2P0010 [redacted]w[redacted]d admitted in active labor  Delivery Note At 1:13 AM a viable female was delivered via Vaginal, Spontaneous Delivery (Presentation: Right Occiput Anterior).  APGAR: 7, 9; weight 7 lb 2.6 oz (3249 g).   Placenta status: Intact, Spontaneous.  Cord: 3 vessels with the following complications: None.  Cord pH: not drawn  Anesthesia: Epidural  Episiotomy: None Lacerations: Periurethral left, hemostatic, well approximated Suture Repair: none Est. Blood Loss (mL): 378  Mom to postpartum.  Baby to Couplet care / Skin to Skin.  Suzanne Goodwin Suzanne Goodwin 03/18/2015, 1:36 AM

## 2014-10-12 NOTE — Telephone Encounter (Signed)
Patient has not contacted office. Will re-file call.  

## 2014-10-18 ENCOUNTER — Inpatient Hospital Stay (HOSPITAL_COMMUNITY)
Admission: AD | Admit: 2014-10-18 | Discharge: 2014-10-18 | Disposition: A | Discharge status: Home / Self Care | Payer: Medicaid Other | Source: Ambulatory Visit | Attending: Obstetrics and Gynecology | Admitting: Obstetrics and Gynecology

## 2014-10-18 ENCOUNTER — Encounter (HOSPITAL_COMMUNITY): Payer: Self-pay | Admitting: *Deleted

## 2014-10-18 DIAGNOSIS — O9989 Other specified diseases and conditions complicating pregnancy, childbirth and the puerperium: Secondary | ICD-10-CM | POA: Diagnosis not present

## 2014-10-18 DIAGNOSIS — Z3A20 20 weeks gestation of pregnancy: Secondary | ICD-10-CM | POA: Insufficient documentation

## 2014-10-18 DIAGNOSIS — R10816 Epigastric abdominal tenderness: Secondary | ICD-10-CM | POA: Diagnosis not present

## 2014-10-18 DIAGNOSIS — Z87891 Personal history of nicotine dependence: Secondary | ICD-10-CM | POA: Diagnosis not present

## 2014-10-18 DIAGNOSIS — R109 Unspecified abdominal pain: Secondary | ICD-10-CM | POA: Diagnosis present

## 2014-10-18 HISTORY — DX: Anxiety disorder, unspecified: F41.9

## 2014-10-18 LAB — CBC
HEMATOCRIT: 31.7 % — AB (ref 36.0–46.0)
Hemoglobin: 11.1 g/dL — ABNORMAL LOW (ref 12.0–15.0)
MCH: 33.3 pg (ref 26.0–34.0)
MCHC: 35 g/dL (ref 30.0–36.0)
MCV: 95.2 fL (ref 78.0–100.0)
Platelets: 275 10*3/uL (ref 150–400)
RBC: 3.33 MIL/uL — AB (ref 3.87–5.11)
RDW: 11.8 % (ref 11.5–15.5)
WBC: 8 10*3/uL (ref 4.0–10.5)

## 2014-10-18 LAB — COMPREHENSIVE METABOLIC PANEL
ALT: 24 U/L (ref 0–35)
ANION GAP: 4 — AB (ref 5–15)
AST: 32 U/L (ref 0–37)
Albumin: 3.4 g/dL — ABNORMAL LOW (ref 3.5–5.2)
Alkaline Phosphatase: 50 U/L (ref 39–117)
BILIRUBIN TOTAL: 0.5 mg/dL (ref 0.3–1.2)
BUN: 8 mg/dL (ref 6–23)
CHLORIDE: 108 meq/L (ref 96–112)
CO2: 24 mmol/L (ref 19–32)
Calcium: 8.8 mg/dL (ref 8.4–10.5)
Creatinine, Ser: 0.59 mg/dL (ref 0.50–1.10)
Glucose, Bld: 88 mg/dL (ref 70–99)
POTASSIUM: 3.8 mmol/L (ref 3.5–5.1)
Sodium: 136 mmol/L (ref 135–145)
Total Protein: 6.3 g/dL (ref 6.0–8.3)

## 2014-10-18 LAB — URINALYSIS, ROUTINE W REFLEX MICROSCOPIC
Bilirubin Urine: NEGATIVE
GLUCOSE, UA: NEGATIVE mg/dL
Hgb urine dipstick: NEGATIVE
KETONES UR: NEGATIVE mg/dL
Nitrite: NEGATIVE
PROTEIN: NEGATIVE mg/dL
Specific Gravity, Urine: 1.015 (ref 1.005–1.030)
UROBILINOGEN UA: 1 mg/dL (ref 0.0–1.0)
pH: 7.5 (ref 5.0–8.0)

## 2014-10-18 LAB — LIPASE, BLOOD: LIPASE: 27 U/L (ref 11–59)

## 2014-10-18 LAB — AMYLASE: Amylase: 66 U/L (ref 0–105)

## 2014-10-18 LAB — URINE MICROSCOPIC-ADD ON

## 2014-10-18 NOTE — MAU Note (Signed)
Patient requesting to leave AMA stating that her mom and boyfriend have to work and she wants to leave with them.  She states she will come back if pain gets worse.  Notified Dr. Wende MottMcKeag of patient request.  Pt signed AMA form and left MAU. VSS.

## 2014-10-18 NOTE — MAU Note (Signed)
Pain in upper abd, started 2 wks ago. Comes and goes, is real sharp. Vomits maybe once a week, denies diarrhea. No relation of pain to eating.

## 2014-10-18 NOTE — MAU Provider Note (Signed)
History     CSN: [id number]  Arrival date and time: 10/18/14 1050   First Provider Initiated Contact with Patient 10/18/14 1147      Chief Complaint  Patient presents with  . Abdominal Pain   The history is provided by the patient.   [name] is a 19 y.o. G2P0010 currently at [redacted]w[redacted]d with history of anxiety and 1st trimester coffee ground hyperemesis who presents to MAU complaining of upper abdominal pain. Onset 2 weeks ago, episodic, sharp, when occurring 8/10, each episode lasting 2-3 minutes, occuring twice daily and every day. Denies any correlation with food intake or activity. Currently has no pain. Endorses nausea and vomiting, but states do not occur concurrently with the abdominal pain and pt attributes to pregnancy. Also reports intermittent low back pain, and urinary frequency but denies dysuria or urgency. Denies any fevers, chills, malaise, constipation or diarrhea, vaginal bleeding or d/c, or upper back pain. No history of gallbladder or liver disease. No current prenatal care, trying to schedule at the [facility].     Past Medical History  Diagnosis Date  . Anxiety     Past Surgical History  Procedure Laterality Date  . No past surgeries    . Wisdom tooth extraction      Family History  Problem Relation Age of Onset  . Hypertension Mother   . Hypertension Father   . Diabetes Paternal Grandmother     History  Substance Use Topics  . Smoking status: Former Smoker -- 0.10 packs/day for 1 years    Quit date: 07/26/2014  . Smokeless tobacco: Never Used  . Alcohol Use: No    Allergies: No Known Allergies  No prescriptions prior to admission    Review of Systems  Constitutional: Negative for fever, chills and malaise/fatigue.  Gastrointestinal: Positive for heartburn (mild, intermittent), nausea, vomiting (1x week) and abdominal pain (epigastric and RUQ). Negative for diarrhea and constipation.  Genitourinary: Positive for frequency. Negative  for dysuria, urgency, hematuria and flank pain.       No VB or D/C  Musculoskeletal: Positive for back pain (low back pain, but denies any upper back pain).   Physical Exam   Blood pressure 101/77, pulse 81, temperature 98.5 F (36.9 C), temperature source Oral, resp. rate 16, weight 115 lb (52.164 kg), last menstrual period 05/30/2014, SpO2 100 %.  Physical Exam  Vitals reviewed. Constitutional: She is oriented to person, place, and time. She appears well-developed and well-nourished. No distress.  HENT:  Head: Normocephalic.  Eyes: Conjunctivae are normal. Pupils are equal, round, and reactive to light. Right eye exhibits no discharge. Left eye exhibits no discharge. No scleral icterus.  Neck: Normal range of motion.  Cardiovascular: Normal rate, regular rhythm and normal heart sounds.   Respiratory: Effort normal and breath sounds normal. No respiratory distress.  GI: Soft. Bowel sounds are normal. She exhibits no distension, no pulsatile liver and no mass. There is no hepatosplenomegaly. There is tenderness in the right upper quadrant and epigastric area. There is no rigidity, no rebound, no guarding, no CVA tenderness and negative Murphy's sign.  Musculoskeletal: Normal range of motion. She exhibits no edema.  Lymphadenopathy:    She has no cervical adenopathy.  Neurological: She is alert and oriented to person, place, and time.  Skin: Skin is warm and dry.    MAU Course  Procedures  None  MDM CBC, CMP, UA nonconcerning Amylase and lipase  normal Results for orders placed or performed during the hospital encounter  of 10/18/14 (from the past 24 hour(s))  Urinalysis, Routine w reflex microscopic     Status: Abnormal   Collection Time: 10/18/14 11:02 AM  Result Value Ref Range   Color, Urine YELLOW YELLOW   APPearance CLOUDY (A) CLEAR   Specific Gravity, Urine 1.015 1.005 - 1.030   pH 7.5 5.0 - 8.0   Glucose, UA NEGATIVE NEGATIVE mg/dL   Hgb urine dipstick NEGATIVE  NEGATIVE   Bilirubin Urine NEGATIVE NEGATIVE   Ketones, ur NEGATIVE NEGATIVE mg/dL   Protein, ur NEGATIVE NEGATIVE mg/dL   Urobilinogen, UA 1.0 0.0 - 1.0 mg/dL   Nitrite NEGATIVE NEGATIVE   Leukocytes, UA LARGE (A) NEGATIVE  Urine microscopic-add on     Status: Abnormal   Collection Time: 10/18/14 11:02 AM  Result Value Ref Range   Squamous Epithelial / LPF MANY (A) RARE   WBC, UA 3-6 <3 WBC/hpf   Bacteria, UA MANY (A) RARE  CBC     Status: Abnormal   Collection Time: 10/18/14 11:55 AM  Result Value Ref Range   WBC 8.0 4.0 - 10.5 K/uL   RBC 3.33 (L) 3.87 - 5.11 MIL/uL   Hemoglobin 11.1 (L) 12.0 - 15.0 g/dL   HCT 66.431.7 (L) 40.336.0 - 47.446.0 %   MCV 95.2 78.0 - 100.0 fL   MCH 33.3 26.0 - 34.0 pg   MCHC 35.0 30.0 - 36.0 g/dL   RDW 25.911.8 56.311.5 - 87.515.5 %   Platelets 275 150 - 400 K/uL  Comprehensive metabolic panel     Status: Abnormal   Collection Time: 10/18/14 11:55 AM  Result Value Ref Range   Sodium 136 135 - 145 mmol/L   Potassium 3.8 3.5 - 5.1 mmol/L   Chloride 108 96 - 112 mEq/L   CO2 24 19 - 32 mmol/L   Glucose, Bld 88 70 - 99 mg/dL   BUN 8 6 - 23 mg/dL   Creatinine, Ser 6.430.59 0.50 - 1.10 mg/dL   Calcium 8.8 8.4 - 32.910.5 mg/dL   Total Protein 6.3 6.0 - 8.3 g/dL   Albumin 3.4 (L) 3.5 - 5.2 g/dL   AST 32 0 - 37 U/L   ALT 24 0 - 35 U/L   Alkaline Phosphatase 50 39 - 117 U/L   Total Bilirubin 0.5 0.3 - 1.2 mg/dL   GFR calc non Af Amer >90 >90 mL/min   GFR calc Af Amer >90 >90 mL/min   Anion gap 4 (L) 5 - 15  Amylase     Status: None   Collection Time: 10/18/14 11:55 AM  Result Value Ref Range   Amylase 66 0 - 105 U/L  Lipase, blood     Status: None   Collection Time: 10/18/14 11:55 AM  Result Value Ref Range   Lipase 27 11 - 59 U/L     Assessment and Plan  1. Epigastric tenderness in pregnancy.  Patient left AMA prior to all laboratory results being complete. Was not able to discuss final diagnosis with patient and provide therapeutic guidance.    Misior, Marlana Salvagenna M  PA-S 10/18/2014 2:57 PM   I did not see this pt but did review her normal lab results after the pt left MAU.  [facility] and follow up regarding symptoms.    Sharen CounterLisa Leftwich-Kirby Certified Nurse-Midwife

## 2014-10-19 ENCOUNTER — Inpatient Hospital Stay (HOSPITAL_COMMUNITY)
Admission: AD | Admit: 2014-10-19 | Discharge: 2014-10-19 | Disposition: A | Discharge status: Home / Self Care | Payer: Medicaid Other | Source: Ambulatory Visit | Attending: Family Medicine | Admitting: Family Medicine

## 2014-10-19 ENCOUNTER — Inpatient Hospital Stay (HOSPITAL_COMMUNITY): Payer: Medicaid Other

## 2014-10-19 ENCOUNTER — Encounter (HOSPITAL_COMMUNITY): Payer: Self-pay | Admitting: General Practice

## 2014-10-19 ENCOUNTER — Other Ambulatory Visit: Payer: Self-pay | Admitting: Advanced Practice Midwife

## 2014-10-19 DIAGNOSIS — R109 Unspecified abdominal pain: Secondary | ICD-10-CM

## 2014-10-19 DIAGNOSIS — O9989 Other specified diseases and conditions complicating pregnancy, childbirth and the puerperium: Secondary | ICD-10-CM | POA: Diagnosis not present

## 2014-10-19 DIAGNOSIS — Z3A19 19 weeks gestation of pregnancy: Secondary | ICD-10-CM | POA: Insufficient documentation

## 2014-10-19 DIAGNOSIS — Z3A20 20 weeks gestation of pregnancy: Secondary | ICD-10-CM | POA: Insufficient documentation

## 2014-10-19 DIAGNOSIS — Z87891 Personal history of nicotine dependence: Secondary | ICD-10-CM | POA: Insufficient documentation

## 2014-10-19 DIAGNOSIS — O26899 Other specified pregnancy related conditions, unspecified trimester: Secondary | ICD-10-CM

## 2014-10-19 DIAGNOSIS — O0932 Supervision of pregnancy with insufficient antenatal care, second trimester: Secondary | ICD-10-CM

## 2014-10-19 LAB — URINALYSIS, ROUTINE W REFLEX MICROSCOPIC
BILIRUBIN URINE: NEGATIVE
GLUCOSE, UA: NEGATIVE mg/dL
Hgb urine dipstick: NEGATIVE
KETONES UR: NEGATIVE mg/dL
Leukocytes, UA: NEGATIVE
Nitrite: NEGATIVE
PH: 8 (ref 5.0–8.0)
PROTEIN: NEGATIVE mg/dL
Specific Gravity, Urine: 1.015 (ref 1.005–1.030)
UROBILINOGEN UA: 1 mg/dL (ref 0.0–1.0)

## 2014-10-19 NOTE — MAU Provider Note (Signed)
History     CSN: [id number]  Arrival date and time: 10/19/14 1031   First Provider Initiated Contact with Patient 10/19/14 1238      Chief Complaint  Patient presents with  . Abdominal Pain   HPI  Pt is a G2P0010 at [redacted]w[redacted]d wks IUP here with report of lower abdominal pain that started two weeks ago.  Pain is described as "pressure" and it is rated a 10/10.  Pain last night was constant in nature.  Reports it occurs every 5-10 minutes today.  Denies vaginal bleeding or leaking of fluid.  Pt has initiated care at [facility] but transferring care to [facility] due to [facility].  Appt has not been scheduled at this time.    Past Medical History  Diagnosis Date  . Anxiety     Past Surgical History  Procedure Laterality Date  . No past surgeries    . Wisdom tooth extraction      Family History  Problem Relation Age of Onset  . Hypertension Mother   . Hypertension Father   . Diabetes Paternal Grandmother     History  Substance Use Topics  . Smoking status: Former Smoker -- 0.10 packs/day for 1 years    Quit date: 07/26/2014  . Smokeless tobacco: Never Used  . Alcohol Use: No    Allergies: No Known Allergies  Prescriptions prior to admission  Medication Sig Dispense Refill Last Dose  . flintstones complete (FLINTSTONES) 60 MG chewable tablet Chew 2 tablets by mouth daily.    10/18/2014 at Unknown time  . prenatal vitamin w/FE, FA (PRENATAL 1 + 1) 27-1 MG TABS tablet Take 1 tablet by mouth daily at 12 noon. May take at another time if desired. (Patient not taking: Reported on 10/18/2014) 30 each 0 Past Month at Unknown time  . promethazine (PHENERGAN) 12.5 MG tablet Take 1 tablet (12.5 mg total) by mouth every 6 (six) hours as needed for nausea or vomiting. (Patient not taking: Reported on 10/18/2014) 30 tablet 0     Review of Systems  Constitutional: Negative for fever and chills.  Gastrointestinal: Positive for abdominal pain. Negative for nausea, vomiting and  diarrhea.  Genitourinary: Negative for dysuria and urgency.  All other systems reviewed and are negative.  Physical Exam   Blood pressure 101/59, pulse 90, temperature 98.7 F (37.1 C), temperature source Oral, resp. rate 16, last menstrual period 05/30/2014.  Physical Exam  Constitutional: She is oriented to person, place, and time. She appears well-developed and well-nourished. No distress.  HENT:  Head: Normocephalic.  Neck: Normal range of motion. Neck supple.  Cardiovascular: Normal rate, regular rhythm and normal heart sounds.   Respiratory: Effort normal and breath sounds normal.  GI: Soft. There is tenderness (with palpation on entire abdomen).  Genitourinary: No bleeding in the vagina.  Cervix - closed  Neurological: She is alert and oriented to person, place, and time.  Skin: Skin is warm and dry.    MAU Course  Procedures  Results for orders placed or performed during the hospital encounter of 10/19/14 (from the past 24 hour(s))  Urinalysis, Routine w reflex microscopic     Status: None   Collection Time: 10/19/14 10:00 AM  Result Value Ref Range   Color, Urine YELLOW YELLOW   APPearance CLEAR CLEAR   Specific Gravity, Urine 1.015 1.005 - 1.030   pH 8.0 5.0 - 8.0   Glucose, UA NEGATIVE NEGATIVE mg/dL   Hgb urine dipstick NEGATIVE NEGATIVE   Bilirubin Urine  NEGATIVE NEGATIVE   Ketones, ur NEGATIVE NEGATIVE mg/dL   Protein, ur NEGATIVE NEGATIVE mg/dL   Urobilinogen, UA 1.0 0.0 - 1.0 mg/dL   Nitrite NEGATIVE NEGATIVE   Leukocytes, UA NEGATIVE NEGATIVE   Ultrasound:   Assessment and Plan  19 yo G2P0010 at 8278w2d wks IUP Abdominal Pain in Pregnancy - Normal Exam  Plan: Discharge to home  Provide reassurance Someone will call you with a prenatal care appointment and time Outpatient anatomy ultrasound scheduled  Marlis EdelsonKARIM, WALIDAH N 10/19/2014, 12:40 PM

## 2014-10-19 NOTE — MAU Note (Signed)
Urine in lab 

## 2014-10-19 NOTE — MAU Note (Signed)
abd pain has gotten worse, couldn't sleep last night.  Pain started in upper abd and sides, now is more at the bottom. Can't get comfortable.

## 2014-10-19 NOTE — Discharge Instructions (Signed)
Abdominal Pain During Pregnancy °Belly (abdominal) pain is common during pregnancy. Most of the time, it is not a serious problem. Other times, it can be a sign that something is wrong with the pregnancy. Always tell your doctor if you have belly pain. °HOME CARE °Monitor your belly pain for any changes. The following actions may help you feel better: °· Do not have sex (intercourse) or put anything in your vagina until you feel better. °· Rest until your pain stops. °· Drink clear fluids if you feel sick to your stomach (nauseous). Do not eat solid food until you feel better. °· Only take medicine as told by your doctor. °· Keep all doctor visits as told. °GET HELP RIGHT AWAY IF:  °· You are bleeding, leaking fluid, or pieces of tissue come out of your vagina. °· You have more pain or cramping. °· You keep throwing up (vomiting). °· You have pain when you pee (urinate) or have blood in your pee. °· You have a fever. °· You do not feel your baby moving as much. °· You feel very weak or feel like passing out. °· You have trouble breathing, with or without belly pain. °· You have a very bad headache and belly pain. °· You have fluid leaking from your vagina and belly pain. °· You keep having watery poop (diarrhea). °· Your belly pain does not go away after resting, or the pain gets worse. °MAKE SURE YOU:  °· Understand these instructions. °· Will watch your condition. °· Will get help right away if you are not doing well or get worse. °Document Released: 09/10/2009 Document Revised: 05/25/2013 Document Reviewed: 04/21/2013 °ExitCare® Patient Information ©2015 [employer]. This information is not intended to replace advice given to you by your health care provider. Make sure you discuss any questions you have with your health care provider. ° °

## 2014-10-25 ENCOUNTER — Telehealth: Payer: Self-pay | Admitting: *Deleted

## 2014-10-25 ENCOUNTER — Ambulatory Visit (HOSPITAL_COMMUNITY)
Admit: 2014-10-25 | Discharge: 2014-10-25 | Disposition: A | Discharge status: Home / Self Care | Payer: Medicaid Other | Attending: Family | Admitting: Family

## 2014-10-25 DIAGNOSIS — O9989 Other specified diseases and conditions complicating pregnancy, childbirth and the puerperium: Secondary | ICD-10-CM | POA: Diagnosis not present

## 2014-10-25 DIAGNOSIS — R109 Unspecified abdominal pain: Secondary | ICD-10-CM | POA: Diagnosis not present

## 2014-10-25 DIAGNOSIS — O26899 Other specified pregnancy related conditions, unspecified trimester: Secondary | ICD-10-CM

## 2014-10-25 DIAGNOSIS — Z3A21 21 weeks gestation of pregnancy: Secondary | ICD-10-CM | POA: Insufficient documentation

## 2014-10-25 DIAGNOSIS — Z36 Encounter for antenatal screening of mother: Secondary | ICD-10-CM | POA: Diagnosis not present

## 2014-10-25 DIAGNOSIS — Z3689 Encounter for other specified antenatal screening: Secondary | ICD-10-CM | POA: Insufficient documentation

## 2014-10-25 NOTE — Telephone Encounter (Signed)
Per chart review results not available at this time.  Has new ob scheduled with clinic 11/08/14 0900.

## 2014-10-25 NOTE — Telephone Encounter (Signed)
-----   Message from Earley BrookeNicole S Dalrymple sent at 10/25/2014  4:01 PM EST ----- Regarding: US results We have just completed a 2nd or 3rd trimester outpatient ultrasound scheduled for a patient who was seen in MAU.  Please call the patient with the results.

## 2014-10-26 ENCOUNTER — Encounter: Payer: Self-pay | Admitting: *Deleted

## 2014-10-26 NOTE — Telephone Encounter (Signed)
Called pt and informed her of US results on 1/20 - all normal however will need re-evaluation of fetal heart in 1 month to obtain better views.  Pt reminded of first clinic appt on 2/3. Pt voiced understanding.

## 2014-10-28 ENCOUNTER — Encounter: Payer: Self-pay | Admitting: Family

## 2014-11-08 ENCOUNTER — Ambulatory Visit (INDEPENDENT_AMBULATORY_CARE_PROVIDER_SITE_OTHER): Payer: Medicaid Other | Admitting: Physician Assistant

## 2014-11-08 ENCOUNTER — Encounter: Payer: Self-pay | Admitting: Physician Assistant

## 2014-11-08 VITALS — BP 105/65 | HR 83 | Wt 118.5 lb

## 2014-11-08 DIAGNOSIS — F419 Anxiety disorder, unspecified: Secondary | ICD-10-CM

## 2014-11-08 DIAGNOSIS — O0930 Supervision of pregnancy with insufficient antenatal care, unspecified trimester: Secondary | ICD-10-CM

## 2014-11-08 DIAGNOSIS — Z3492 Encounter for supervision of normal pregnancy, unspecified, second trimester: Secondary | ICD-10-CM

## 2014-11-08 DIAGNOSIS — Z3482 Encounter for supervision of other normal pregnancy, second trimester: Secondary | ICD-10-CM

## 2014-11-08 DIAGNOSIS — Z3490 Encounter for supervision of normal pregnancy, unspecified, unspecified trimester: Secondary | ICD-10-CM

## 2014-11-08 LAB — POCT URINALYSIS DIP (DEVICE)
Bilirubin Urine: NEGATIVE
Glucose, UA: NEGATIVE mg/dL
Hgb urine dipstick: NEGATIVE
Ketones, ur: NEGATIVE mg/dL
Nitrite: NEGATIVE
PH: 7 (ref 5.0–8.0)
PROTEIN: NEGATIVE mg/dL
Specific Gravity, Urine: 1.015 (ref 1.005–1.030)
UROBILINOGEN UA: 0.2 mg/dL (ref 0.0–1.0)

## 2014-11-08 NOTE — Addendum Note (Signed)
Addended by: Candelaria StagersHAIZLIP, Porschea Borys E on: 11/08/2014 02:02 PM   Modules accepted: Orders

## 2014-11-08 NOTE — Progress Notes (Signed)
  Subjective:    Suzanne Goodwin is a G2P0010 4063w1d being seen today for her first obstetrical visit.  Her obstetrical history is significant for anxiety. Patient does intend to breast feed. Pregnancy history fully reviewed.  Patient reports no complaints.  Filed Vitals:   11/08/14 0922  BP: 105/65  Pulse: 83  Weight: 118 lb 8 oz (53.751 kg)    HISTORY: OB History  Gravida Para Term Preterm AB SAB TAB Ectopic Multiple Living  2 0 0 0 1 1 0 0 0 0     # Outcome Date GA Lbr Len/2nd Weight Sex Delivery Anes PTL Lv  2 Current           1 SAB 2011             Past Medical History  Diagnosis Date  . Anxiety    Past Surgical History  Procedure Laterality Date  . No past surgeries    . Wisdom tooth extraction     Family History  Problem Relation Age of Onset  . Hypertension Mother   . Hypertension Father   . Diabetes Paternal Grandmother      Exam    Uterus:     Pelvic Exam:    Perineum: No Hemorrhoids, Normal Perineum   Vulva: normal   Vagina:  normal mucosa, normal discharge   pH:    Cervix: no cervical motion tenderness   Adnexa: normal adnexa and no mass, fullness, tenderness   Bony Pelvis: average  System: Breast:  normal appearance, no masses or tenderness   Skin: normal coloration and turgor, no rashes    Neurologic: oriented, normal mood   Extremities: normal strength, tone, and muscle mass   HEENT extra ocular movement intact   Mouth/Teeth mucous membranes moist, pharynx normal without lesions and dental hygiene good   Neck supple and no masses   Cardiovascular: regular rate and rhythm, no murmurs or gallops   Respiratory:  appears well, vitals normal, no respiratory distress, acyanotic, normal RR, ear and throat exam is normal, neck free of mass or lymphadenopathy, chest clear, no wheezing, crepitations, rhonchi, normal symmetric air entry   Abdomen: soft, non-tender; bowel sounds normal; no masses,  no organomegaly   Urinary: urethral meatus normal       Assessment:    Pregnancy: G2P0010 Patient Active Problem List   Diagnosis Date Noted  . Anxiety 11/08/2014  . Supervision of normal pregnancy 10/28/2014  . Encounter for fetal anatomic survey   . [redacted] weeks gestation of pregnancy   . Abdominal pain in pregnancy, antepartum         Plan:     Initial labs drawn. Prenatal vitamins. Problem list reviewed and updated. Genetic Screening discussed Quad Screen: too late.  Ultrasound discussed; fetal survey: ordered.  Follow up in 4 weeks. 90% of 30 min visit spent on counseling and coordination of care.     Bertram Denvereague Clark, Karen E 11/08/2014

## 2014-11-08 NOTE — Patient Instructions (Signed)
Second Trimester of Pregnancy The second trimester is from week 13 through week 28, months 4 through 6. The second trimester is often a time when you feel your best. Your body has also adjusted to being pregnant, and you begin to feel better physically. Usually, morning sickness has lessened or quit completely, you may have more energy, and you may have an increase in appetite. The second trimester is also a time when the fetus is growing rapidly. At the end of the sixth month, the fetus is about 9 inches long and weighs about 1 pounds. You will likely begin to feel the baby move (quickening) between 18 and 20 weeks of the pregnancy. BODY CHANGES Your body goes through many changes during pregnancy. The changes vary from woman to woman.   Your weight will continue to increase. You will notice your lower abdomen bulging out.  You may begin to get stretch marks on your hips, abdomen, and breasts.  You may develop headaches that can be relieved by medicines approved by your health care provider.  You may urinate more often because the fetus is pressing on your bladder.  You may develop or continue to have heartburn as a result of your pregnancy.  You may develop constipation because certain hormones are causing the muscles that push waste through your intestines to slow down.  You may develop hemorrhoids or swollen, bulging veins (varicose veins).  You may have back pain because of the weight gain and pregnancy hormones relaxing your joints between the bones in your pelvis and as a result of a shift in weight and the muscles that support your balance.  Your breasts will continue to grow and be tender.  Your gums may bleed and may be sensitive to brushing and flossing.  Dark spots or blotches (chloasma, mask of pregnancy) may develop on your face. This will likely fade after the baby is born.  A dark line from your belly button to the pubic area (linea nigra) may appear. This will likely fade  after the baby is born.  You may have changes in your hair. These can include thickening of your hair, rapid growth, and changes in texture. Some women also have hair loss during or after pregnancy, or hair that feels dry or thin. Your hair will most likely return to normal after your baby is born. WHAT TO EXPECT AT YOUR PRENATAL VISITS During a routine prenatal visit:  You will be weighed to make sure you and the fetus are growing normally.  Your blood pressure will be taken.  Your abdomen will be measured to track your baby's growth.  The fetal heartbeat will be listened to.  Any test results from the previous visit will be discussed. Your health care provider may ask you:  How you are feeling.  If you are feeling the baby move.  If you have had any abnormal symptoms, such as leaking fluid, bleeding, severe headaches, or abdominal cramping.  If you have any questions. Other tests that may be performed during your second trimester include:  Blood tests that check for:  Low iron levels (anemia).  Gestational diabetes (between 24 and 28 weeks).  Rh antibodies.  Urine tests to check for infections, diabetes, or protein in the urine.  An ultrasound to confirm the proper growth and development of the baby.  An amniocentesis to check for possible genetic problems.  Fetal screens for spina bifida and Down syndrome. HOME CARE INSTRUCTIONS   Avoid all smoking, herbs, alcohol, and unprescribed   drugs. These chemicals affect the formation and growth of the baby.  Follow your health care provider's instructions regarding medicine use. There are medicines that are either safe or unsafe to take during pregnancy.  Exercise only as directed by your health care provider. Experiencing uterine cramps is a good sign to stop exercising.  Continue to eat regular, healthy meals.  Wear a good support bra for breast tenderness.  Do not use hot tubs, steam rooms, or saunas.  Wear your  seat belt at all times when driving.  Avoid raw meat, uncooked cheese, cat litter boxes, and soil used by cats. These carry germs that can cause birth defects in the baby.  Take your prenatal vitamins.  Try taking a stool softener (if your health care provider approves) if you develop constipation. Eat more high-fiber foods, such as fresh vegetables or fruit and whole grains. Drink plenty of fluids to keep your urine clear or pale yellow.  Take warm sitz baths to soothe any pain or discomfort caused by hemorrhoids. Use hemorrhoid cream if your health care provider approves.  If you develop varicose veins, wear support hose. Elevate your feet for 15 minutes, 3-4 times a day. Limit salt in your diet.  Avoid heavy lifting, wear low heel shoes, and practice good posture.  Rest with your legs elevated if you have leg cramps or low back pain.  Visit your dentist if you have not gone yet during your pregnancy. Use a soft toothbrush to brush your teeth and be gentle when you floss.  A sexual relationship may be continued unless your health care provider directs you otherwise.  Continue to go to all your prenatal visits as directed by your health care provider. SEEK MEDICAL CARE IF:   You have dizziness.  You have mild pelvic cramps, pelvic pressure, or nagging pain in the abdominal area.  You have persistent nausea, vomiting, or diarrhea.  You have a bad smelling vaginal discharge.  You have pain with urination. SEEK IMMEDIATE MEDICAL CARE IF:   You have a fever.  You are leaking fluid from your vagina.  You have spotting or bleeding from your vagina.  You have severe abdominal cramping or pain.  You have rapid weight gain or loss.  You have shortness of breath with chest pain.  You notice sudden or extreme swelling of your face, hands, ankles, feet, or legs.  You have not felt your baby move in over an hour.  You have severe headaches that do not go away with  medicine.  You have vision changes. Document Released: 09/16/2001 Document Revised: 09/27/2013 Document Reviewed: 11/23/2012 ExitCare Patient Information 2015 [employer]. This information is not intended to replace advice given to you by your health care provider. Make sure you discuss any questions you have with your health care provider.  

## 2014-11-09 LAB — PRESCRIPTION MONITORING PROFILE (19 PANEL)
Amphetamine/Meth: NEGATIVE ng/mL
Barbiturate Screen, Urine: NEGATIVE ng/mL
Benzodiazepine Screen, Urine: NEGATIVE ng/mL
Buprenorphine, Urine: NEGATIVE ng/mL
CANNABINOID SCRN UR: NEGATIVE ng/mL
COCAINE METABOLITES: NEGATIVE ng/mL
Carisoprodol, Urine: NEGATIVE ng/mL
Creatinine, Urine: 86.79 mg/dL (ref >20.0)
ECSTASY: NEGATIVE ng/mL
FENTANYL URINE: NEGATIVE ng/mL
MEPERIDINE UR: NEGATIVE ng/mL
Methadone Screen, Urine: NEGATIVE ng/mL
Methaqualone: NEGATIVE ng/mL
Nitrites, Initial: NEGATIVE ug/mL
Opiate Screen, Urine: NEGATIVE ng/mL
Oxycodone Screen, Ur: NEGATIVE ng/mL
PHENCYCLIDINE, UR: NEGATIVE ng/mL
PROPOXYPHENE: NEGATIVE ng/mL
TRAMADOL UR: NEGATIVE ng/mL
Tapentadol, urine: NEGATIVE ng/mL
ZOLPIDEM, URINE: NEGATIVE ng/mL
pH, Initial: 7.5 pH (ref 4.5–8.9)

## 2014-11-09 LAB — PRENATAL PROFILE (SOLSTAS)
ANTIBODY SCREEN: NEGATIVE
Basophils Absolute: 0 10*3/uL (ref 0.0–0.1)
Basophils Relative: 0 % (ref 0–1)
EOS PCT: 1 % (ref 0–5)
Eosinophils Absolute: 0.1 10*3/uL (ref 0.0–0.7)
HCT: 33.5 % — ABNORMAL LOW (ref 36.0–46.0)
HEMOGLOBIN: 11.3 g/dL — AB (ref 12.0–15.0)
HIV: NONREACTIVE
Hepatitis B Surface Ag: NEGATIVE
LYMPHS PCT: 14 % (ref 12–46)
Lymphs Abs: 1.3 10*3/uL (ref 0.7–4.0)
MCH: 32.8 pg (ref 26.0–34.0)
MCHC: 33.7 g/dL (ref 30.0–36.0)
MCV: 97.4 fL (ref 78.0–100.0)
MONOS PCT: 10 % (ref 3–12)
MPV: 9.9 fL (ref 8.6–12.4)
Monocytes Absolute: 0.9 10*3/uL (ref 0.1–1.0)
NEUTROS ABS: 7 10*3/uL (ref 1.7–7.7)
Neutrophils Relative %: 75 % (ref 43–77)
Platelets: 287 10*3/uL (ref 150–400)
RBC: 3.44 MIL/uL — ABNORMAL LOW (ref 3.87–5.11)
RDW: 12.5 % (ref 11.5–15.5)
Rh Type: POSITIVE
Rubella: 1.97 Index — ABNORMAL HIGH (ref <0.90)
WBC: 9.3 10*3/uL (ref 4.0–10.5)

## 2014-11-09 LAB — CULTURE, OB URINE
COLONY COUNT: NO GROWTH
Organism ID, Bacteria: NO GROWTH

## 2014-11-09 LAB — GC/CHLAMYDIA PROBE AMP
CT Probe RNA: NEGATIVE
GC Probe RNA: NEGATIVE

## 2014-11-15 ENCOUNTER — Encounter: Payer: Self-pay | Admitting: *Deleted

## 2014-12-04 ENCOUNTER — Ambulatory Visit (HOSPITAL_COMMUNITY)
Admission: RE | Admit: 2014-12-04 | Discharge: 2014-12-04 | Disposition: A | Discharge status: Home / Self Care | Payer: Medicaid Other | Source: Ambulatory Visit | Attending: Physician Assistant | Admitting: Physician Assistant

## 2014-12-04 DIAGNOSIS — Z3A25 25 weeks gestation of pregnancy: Secondary | ICD-10-CM | POA: Diagnosis not present

## 2014-12-04 DIAGNOSIS — Z36 Encounter for antenatal screening of mother: Secondary | ICD-10-CM | POA: Diagnosis not present

## 2014-12-04 DIAGNOSIS — Z3490 Encounter for supervision of normal pregnancy, unspecified, unspecified trimester: Secondary | ICD-10-CM

## 2014-12-04 DIAGNOSIS — Z3482 Encounter for supervision of other normal pregnancy, second trimester: Secondary | ICD-10-CM

## 2014-12-04 DIAGNOSIS — O358XX0 Maternal care for other (suspected) fetal abnormality and damage, not applicable or unspecified: Secondary | ICD-10-CM | POA: Diagnosis not present

## 2014-12-04 DIAGNOSIS — Z0489 Encounter for examination and observation for other specified reasons: Secondary | ICD-10-CM | POA: Insufficient documentation

## 2014-12-04 DIAGNOSIS — IMO0002 Reserved for concepts with insufficient information to code with codable children: Secondary | ICD-10-CM | POA: Insufficient documentation

## 2014-12-04 DIAGNOSIS — Z3A26 26 weeks gestation of pregnancy: Secondary | ICD-10-CM | POA: Insufficient documentation

## 2014-12-06 ENCOUNTER — Encounter: Payer: Medicaid Other | Admitting: Obstetrics & Gynecology

## 2014-12-07 ENCOUNTER — Encounter: Payer: Self-pay | Admitting: Advanced Practice Midwife

## 2014-12-07 ENCOUNTER — Ambulatory Visit (INDEPENDENT_AMBULATORY_CARE_PROVIDER_SITE_OTHER): Payer: Medicaid Other | Admitting: Advanced Practice Midwife

## 2014-12-07 VITALS — BP 115/65 | HR 74 | Temp 98.1°F | Wt 122.0 lb

## 2014-12-07 DIAGNOSIS — Z23 Encounter for immunization: Secondary | ICD-10-CM

## 2014-12-07 DIAGNOSIS — Z3483 Encounter for supervision of other normal pregnancy, third trimester: Secondary | ICD-10-CM

## 2014-12-07 DIAGNOSIS — O283 Abnormal ultrasonic finding on antenatal screening of mother: Secondary | ICD-10-CM

## 2014-12-07 DIAGNOSIS — Z3482 Encounter for supervision of other normal pregnancy, second trimester: Secondary | ICD-10-CM

## 2014-12-07 DIAGNOSIS — Z3490 Encounter for supervision of normal pregnancy, unspecified, unspecified trimester: Secondary | ICD-10-CM

## 2014-12-07 LAB — POCT URINALYSIS DIP (DEVICE)
BILIRUBIN URINE: NEGATIVE
GLUCOSE, UA: NEGATIVE mg/dL
HGB URINE DIPSTICK: NEGATIVE
KETONES UR: NEGATIVE mg/dL
NITRITE: NEGATIVE
PH: 7.5 (ref 5.0–8.0)
Protein, ur: NEGATIVE mg/dL
Specific Gravity, Urine: 1.02 (ref 1.005–1.030)
Urobilinogen, UA: 1 mg/dL (ref 0.0–1.0)

## 2014-12-07 LAB — CBC
HCT: 30.9 % — ABNORMAL LOW (ref 36.0–46.0)
Hemoglobin: 10.6 g/dL — ABNORMAL LOW (ref 12.0–15.0)
MCH: 32.9 pg (ref 26.0–34.0)
MCHC: 34.3 g/dL (ref 30.0–36.0)
MCV: 96 fL (ref 78.0–100.0)
MPV: 9.7 fL (ref 8.6–12.4)
Platelets: 273 10*3/uL (ref 150–400)
RBC: 3.22 MIL/uL — ABNORMAL LOW (ref 3.87–5.11)
RDW: 12.4 % (ref 11.5–15.5)
WBC: 7.9 10*3/uL (ref 4.0–10.5)

## 2014-12-07 MED ORDER — TETANUS-DIPHTH-ACELL PERTUSSIS 5-2.5-18.5 LF-MCG/0.5 IM SUSP
0.5000 mL | Freq: Once | INTRAMUSCULAR | Status: AC
  Administered 2014-12-07: 0.5 mL via INTRAMUSCULAR

## 2014-12-07 NOTE — Progress Notes (Signed)
1hr gtt and 28 week labs today.  Agrees to tdap today.  28 week packet given.

## 2014-12-07 NOTE — Progress Notes (Signed)
Glucola and TDAP today. Clarified EDC. Reviewed records from other practice >> EDC changed from 5/31 to 6/7 based on 12 week US.

## 2014-12-07 NOTE — Patient Instructions (Signed)
Glucose Tolerance Test This is a test to see how your body processes carbohydrates. This test is often done to check patients for diabetes or the possibility of developing it. PREPARATION FOR TEST You should have nothing to eat or drink 12 hours before the test. You will be given a form of sugar (glucose) and then blood samples will be drawn from your vein to determine the level of sugar in your blood. Alternatively, blood may be drawn from your finger for testing. You should not smoke or exercise during the test. NORMAL FINDINGS  Fasting: 70-115 mg/dL  30 minutes: less than 200 mg/dL  1 hour: less than 604 mg/dL  2 hours: less than 540 mg/dL  3 hours: 98-119 mg/dL  4 hours: 14-782 mg/dL Ranges for normal findings may vary among different laboratories and hospitals. You should always check with your doctor after having lab work or other tests done to discuss the meaning of your test results and whether your values are considered within normal limits. MEANING OF TEST Your caregiver will go over the test results with you and discuss the importance and meaning of your results, as well as treatment options and the need for additional tests. OBTAINING THE TEST RESULTS It is your responsibility to obtain your test results. Ask the lab or department performing the test when and how you will get your results. Document Released: 10/15/2004 Document Revised: 12/15/2011 Document Reviewed: 01/27/2014 [facility] Patient Information 2015 [place], [place]. This information is not intended to replace advice given to you by your health care provider. Make sure you discuss any questions you have with your health care provider. Second Trimester of Pregnancy The second trimester is from week 13 through week 28, months 4 through 6. The second trimester is often a time when you feel your best. Your body has also adjusted to being pregnant, and you begin to feel better physically. Usually, morning sickness has  lessened or quit completely, you may have more energy, and you may have an increase in appetite. The second trimester is also a time when the fetus is growing rapidly. At the end of the sixth month, the fetus is about 9 inches long and weighs about 1 pounds. You will likely begin to feel the baby move (quickening) between 18 and 20 weeks of the pregnancy. BODY CHANGES Your body goes through many changes during pregnancy. The changes vary from woman to woman.   Your weight will continue to increase. You will notice your lower abdomen bulging out.  You may begin to get stretch marks on your hips, abdomen, and breasts.  You may develop headaches that can be relieved by medicines approved by your health care provider.  You may urinate more often because the fetus is pressing on your bladder.  You may develop or continue to have heartburn as a result of your pregnancy.  You may develop constipation because certain hormones are causing the muscles that push waste through your intestines to slow down.  You may develop hemorrhoids or swollen, bulging veins (varicose veins).  You may have back pain because of the weight gain and pregnancy hormones relaxing your joints between the bones in your pelvis and as a result of a shift in weight and the muscles that support your balance.  Your breasts will continue to grow and be tender.  Your gums may bleed and may be sensitive to brushing and flossing.  Dark spots or blotches (chloasma, mask of pregnancy) may develop on your face. This will likely  fade after the baby is born.  A dark line from your belly button to the pubic area (linea nigra) may appear. This will likely fade after the baby is born.  You may have changes in your hair. These can include thickening of your hair, rapid growth, and changes in texture. Some women also have hair loss during or after pregnancy, or hair that feels dry or thin. Your hair will most likely return to normal after  your baby is born. WHAT TO EXPECT AT YOUR PRENATAL VISITS During a routine prenatal visit:  You will be weighed to make sure you and the fetus are growing normally.  Your blood pressure will be taken.  Your abdomen will be measured to track your baby's growth.  The fetal heartbeat will be listened to.  Any test results from the previous visit will be discussed. Your health care provider may ask you:  How you are feeling.  If you are feeling the baby move.  If you have had any abnormal symptoms, such as leaking fluid, bleeding, severe headaches, or abdominal cramping.  If you have any questions. Other tests that may be performed during your second trimester include:  Blood tests that check for:  Low iron levels (anemia).  Gestational diabetes (between 24 and 28 weeks).  Rh antibodies.  Urine tests to check for infections, diabetes, or protein in the urine.  An ultrasound to confirm the proper growth and development of the baby.  An amniocentesis to check for possible genetic problems.  Fetal screens for spina bifida and Down syndrome. HOME CARE INSTRUCTIONS   Avoid all smoking, herbs, alcohol, and unprescribed drugs. These chemicals affect the formation and growth of the baby.  Follow your health care provider's instructions regarding medicine use. There are medicines that are either safe or unsafe to take during pregnancy.  Exercise only as directed by your health care provider. Experiencing uterine cramps is a good sign to stop exercising.  Continue to eat regular, healthy meals.  Wear a good support bra for breast tenderness.  Do not use hot tubs, steam rooms, or saunas.  Wear your seat belt at all times when driving.  Avoid raw meat, uncooked cheese, cat litter boxes, and soil used by cats. These carry germs that can cause birth defects in the baby.  Take your prenatal vitamins.  Try taking a stool softener (if your health care provider approves) if you  develop constipation. Eat more high-fiber foods, such as fresh vegetables or fruit and whole grains. Drink plenty of fluids to keep your urine clear or pale yellow.  Take warm sitz baths to soothe any pain or discomfort caused by hemorrhoids. Use hemorrhoid cream if your health care provider approves.  If you develop varicose veins, wear support hose. Elevate your feet for 15 minutes, 3-4 times a day. Limit salt in your diet.  Avoid heavy lifting, wear low heel shoes, and practice good posture.  Rest with your legs elevated if you have leg cramps or low back pain.  Visit your dentist if you have not gone yet during your pregnancy. Use a soft toothbrush to brush your teeth and be gentle when you floss.  A sexual relationship may be continued unless your health care provider directs you otherwise.  Continue to go to all your prenatal visits as directed by your health care provider. SEEK MEDICAL CARE IF:   You have dizziness.  You have mild pelvic cramps, pelvic pressure, or nagging pain in the abdominal area.  You  have persistent nausea, vomiting, or diarrhea.  You have a bad smelling vaginal discharge.  You have pain with urination. SEEK IMMEDIATE MEDICAL CARE IF:   You have a fever.  You are leaking fluid from your vagina.  You have spotting or bleeding from your vagina.  You have severe abdominal cramping or pain.  You have rapid weight gain or loss.  You have shortness of breath with chest pain.  You notice sudden or extreme swelling of your face, hands, ankles, feet, or legs.  You have not felt your baby move in over an hour.  You have severe headaches that do not go away with medicine.  You have vision changes. Document Released: 09/16/2001 Document Revised: 09/27/2013 Document Reviewed: 11/23/2012 [facility] Patient Information 2015 [place], [place]. This information is not intended to replace advice given to you by your health care provider. Make sure you discuss  any questions you have with your health care provider.

## 2014-12-08 ENCOUNTER — Encounter: Payer: Self-pay | Admitting: *Deleted

## 2014-12-08 LAB — HIV ANTIBODY (ROUTINE TESTING W REFLEX): HIV 1&2 Ab, 4th Generation: NONREACTIVE

## 2014-12-08 LAB — GLUCOSE TOLERANCE, 1 HOUR (50G) W/O FASTING: GLUCOSE 1 HOUR GTT: 79 mg/dL (ref 70–140)

## 2014-12-08 LAB — RPR

## 2014-12-21 ENCOUNTER — Encounter: Payer: Medicaid Other | Admitting: Physician Assistant

## 2015-01-01 ENCOUNTER — Encounter: Payer: Medicaid Other | Admitting: Family Medicine

## 2015-01-03 ENCOUNTER — Encounter: Payer: Self-pay | Admitting: Obstetrics and Gynecology

## 2015-01-03 ENCOUNTER — Ambulatory Visit (INDEPENDENT_AMBULATORY_CARE_PROVIDER_SITE_OTHER): Payer: Medicaid Other | Admitting: Obstetrics and Gynecology

## 2015-01-03 VITALS — BP 108/66 | HR 87 | Wt 129.2 lb

## 2015-01-03 DIAGNOSIS — Z3483 Encounter for supervision of other normal pregnancy, third trimester: Secondary | ICD-10-CM

## 2015-01-03 LAB — POCT URINALYSIS DIP (DEVICE)
BILIRUBIN URINE: NEGATIVE
GLUCOSE, UA: NEGATIVE mg/dL
Hgb urine dipstick: NEGATIVE
Ketones, ur: NEGATIVE mg/dL
Nitrite: NEGATIVE
Protein, ur: NEGATIVE mg/dL
UROBILINOGEN UA: 1 mg/dL (ref 0.0–1.0)
pH: 6 (ref 5.0–8.0)

## 2015-01-03 NOTE — Progress Notes (Signed)
Mod leuks

## 2015-01-03 NOTE — Patient Instructions (Signed)
Third Trimester of Pregnancy The third trimester is from week 29 through week 42, months 7 through 9. The third trimester is a time when the fetus is growing rapidly. At the end of the ninth month, the fetus is about 20 inches in length and weighs 6-10 pounds.  BODY CHANGES Your body goes through many changes during pregnancy. The changes vary from woman to woman.   Your weight will continue to increase. You can expect to gain 25-35 pounds (11-16 kg) by the end of the pregnancy.  You may begin to get stretch marks on your hips, abdomen, and breasts.  You may urinate more often because the fetus is moving lower into your pelvis and pressing on your bladder.  You may develop or continue to have heartburn as a result of your pregnancy.  You may develop constipation because certain hormones are causing the muscles that push waste through your intestines to slow down.  You may develop hemorrhoids or swollen, bulging veins (varicose veins).  You may have pelvic pain because of the weight gain and pregnancy hormones relaxing your joints between the bones in your pelvis. Backaches may result from overexertion of the muscles supporting your posture.  You may have changes in your hair. These can include thickening of your hair, rapid growth, and changes in texture. Some women also have hair loss during or after pregnancy, or hair that feels dry or thin. Your hair will most likely return to normal after your baby is born.  Your breasts will continue to grow and be tender. A yellow discharge may leak from your breasts called colostrum.  Your belly button may stick out.  You may feel short of breath because of your expanding uterus.  You may notice the fetus "dropping," or moving lower in your abdomen.  You may have a bloody mucus discharge. This usually occurs a few days to a week before labor begins.  Your cervix becomes thin and soft (effaced) near your due date. WHAT TO EXPECT AT YOUR PRENATAL  EXAMS  You will have prenatal exams every 2 weeks until week 36. Then, you will have weekly prenatal exams. During a routine prenatal visit:  You will be weighed to make sure you and the fetus are growing normally.  Your blood pressure is taken.  Your abdomen will be measured to track your baby's growth.  The fetal heartbeat will be listened to.  Any test results from the previous visit will be discussed.  You may have a cervical check near your due date to see if you have effaced. At around 36 weeks, your caregiver will check your cervix. At the same time, your caregiver will also perform a test on the secretions of the vaginal tissue. This test is to determine if a type of bacteria, Group B streptococcus, is present. Your caregiver will explain this further. Your caregiver may ask you:  What your birth plan is.  How you are feeling.  If you are feeling the baby move.  If you have had any abnormal symptoms, such as leaking fluid, bleeding, severe headaches, or abdominal cramping.  If you have any questions. Other tests or screenings that may be performed during your third trimester include:  Blood tests that check for low iron levels (anemia).  Fetal testing to check the health, activity level, and growth of the fetus. Testing is done if you have certain medical conditions or if there are problems during the pregnancy. FALSE LABOR You may feel small, irregular contractions that   eventually go away. These are called Braxton Hicks contractions, or false labor. Contractions may last for hours, days, or even weeks before true labor sets in. If contractions come at regular intervals, intensify, or become painful, it is best to be seen by your caregiver.  SIGNS OF LABOR   Menstrual-like cramps.  Contractions that are 5 minutes apart or less.  Contractions that start on the top of the uterus and spread down to the lower abdomen and back.  A sense of increased pelvic pressure or back  pain.  A watery or bloody mucus discharge that comes from the vagina. If you have any of these signs before the 37th week of pregnancy, call your caregiver right away. You need to go to the hospital to get checked immediately. HOME CARE INSTRUCTIONS   Avoid all smoking, herbs, alcohol, and unprescribed drugs. These chemicals affect the formation and growth of the baby.  Follow your caregiver's instructions regarding medicine use. There are medicines that are either safe or unsafe to take during pregnancy.  Exercise only as directed by your caregiver. Experiencing uterine cramps is a good sign to stop exercising.  Continue to eat regular, healthy meals.  Wear a good support bra for breast tenderness.  Do not use hot tubs, steam rooms, or saunas.  Wear your seat belt at all times when driving.  Avoid raw meat, uncooked cheese, cat litter boxes, and soil used by cats. These carry germs that can cause birth defects in the baby.  Take your prenatal vitamins.  Try taking a stool softener (if your caregiver approves) if you develop constipation. Eat more high-fiber foods, such as fresh vegetables or fruit and whole grains. Drink plenty of fluids to keep your urine clear or pale yellow.  Take warm sitz baths to soothe any pain or discomfort caused by hemorrhoids. Use hemorrhoid cream if your caregiver approves.  If you develop varicose veins, wear support hose. Elevate your feet for 15 minutes, 3-4 times a day. Limit salt in your diet.  Avoid heavy lifting, wear low heal shoes, and practice good posture.  Rest a lot with your legs elevated if you have leg cramps or low back pain.  Visit your dentist if you have not gone during your pregnancy. Use a soft toothbrush to brush your teeth and be gentle when you floss.  A sexual relationship may be continued unless your caregiver directs you otherwise.  Do not travel far distances unless it is absolutely necessary and only with the approval  of your caregiver.  Take prenatal classes to understand, practice, and ask questions about the labor and delivery.  Make a trial run to the hospital.  Pack your hospital bag.  Prepare the baby's nursery.  Continue to go to all your prenatal visits as directed by your caregiver. SEEK MEDICAL CARE IF:  You are unsure if you are in labor or if your water has broken.  You have dizziness.  You have mild pelvic cramps, pelvic pressure, or nagging pain in your abdominal area.  You have persistent nausea, vomiting, or diarrhea.  You have a bad smelling vaginal discharge.  You have pain with urination. SEEK IMMEDIATE MEDICAL CARE IF:   You have a fever.  You are leaking fluid from your vagina.  You have spotting or bleeding from your vagina.  You have severe abdominal cramping or pain.  You have rapid weight loss or gain.  You have shortness of breath with chest pain.  You notice sudden or extreme swelling   of your face, hands, ankles, feet, or legs.  You have not felt your baby move in over an hour.  You have severe headaches that do not go away with medicine.  You have vision changes. Document Released: 09/16/2001 Document Revised: 09/27/2013 Document Reviewed: 11/23/2012 [employer] Patient Information 2015 [employer]. This information is not intended to replace advice given to you by your health care provider. Make sure you discuss any questions you have with your health care provider.  

## 2015-01-03 NOTE — Progress Notes (Signed)
Doing well. Good FM. Eating good diet. Interested in waterbirth> information on classes.  SW saw pt.

## 2015-01-09 ENCOUNTER — Encounter (HOSPITAL_COMMUNITY): Payer: Self-pay | Admitting: *Deleted

## 2015-01-09 ENCOUNTER — Inpatient Hospital Stay (HOSPITAL_COMMUNITY)
Admission: AD | Admit: 2015-01-09 | Discharge: 2015-01-09 | Disposition: A | Discharge status: Home / Self Care | Payer: Medicaid Other | Source: Ambulatory Visit | Attending: Family Medicine | Admitting: Family Medicine

## 2015-01-09 DIAGNOSIS — Z3A31 31 weeks gestation of pregnancy: Secondary | ICD-10-CM | POA: Diagnosis not present

## 2015-01-09 DIAGNOSIS — Z87891 Personal history of nicotine dependence: Secondary | ICD-10-CM | POA: Insufficient documentation

## 2015-01-09 DIAGNOSIS — N949 Unspecified condition associated with female genital organs and menstrual cycle: Secondary | ICD-10-CM

## 2015-01-09 DIAGNOSIS — O26893 Other specified pregnancy related conditions, third trimester: Secondary | ICD-10-CM

## 2015-01-09 DIAGNOSIS — O4703 False labor before 37 completed weeks of gestation, third trimester: Secondary | ICD-10-CM | POA: Insufficient documentation

## 2015-01-09 DIAGNOSIS — O479 False labor, unspecified: Secondary | ICD-10-CM

## 2015-01-09 DIAGNOSIS — R109 Unspecified abdominal pain: Secondary | ICD-10-CM | POA: Diagnosis present

## 2015-01-09 LAB — URINALYSIS, ROUTINE W REFLEX MICROSCOPIC
BILIRUBIN URINE: NEGATIVE
Glucose, UA: NEGATIVE mg/dL
Hgb urine dipstick: NEGATIVE
Ketones, ur: NEGATIVE mg/dL
NITRITE: NEGATIVE
Protein, ur: NEGATIVE mg/dL
SPECIFIC GRAVITY, URINE: 1.025 (ref 1.005–1.030)
UROBILINOGEN UA: 0.2 mg/dL (ref 0.0–1.0)
pH: 5.5 (ref 5.0–8.0)

## 2015-01-09 LAB — URINE MICROSCOPIC-ADD ON

## 2015-01-09 NOTE — MAU Provider Note (Signed)
Chief Complaint:  pelvic pressure    First Provider Initiated Contact with Patient 01/09/15 1517      HPI: Suzanne Goodwin is a 19 y.o. G2P0010 at 4213w0d who presents to maternity admissions reporting cramping and pelvic pressure with onset yesterday, worsening today while sitting at work.  She reports good fetal movement, denies LOF, vaginal bleeding, vaginal itching/burning, urinary symptoms, h/a, dizziness, n/v, or fever/chills.    Abdominal Pain This is a new problem. The current episode started yesterday. The onset quality is sudden. The problem occurs intermittently. The most recent episode lasted 2 days. The problem has been waxing and waning. The pain is located in the generalized abdominal region. The pain is mild. The quality of the pain is cramping. The abdominal pain radiates to the pelvis. Pertinent negatives include no constipation, diarrhea, dysuria, fever, frequency, headaches, nausea or vomiting. Associated symptoms comments: none. The pain is aggravated by certain positions. She has tried nothing for the symptoms.    Past Medical History: Past Medical History  Diagnosis Date  . Anxiety     Past obstetric history: OB History  Gravida Para Term Preterm AB SAB TAB Ectopic Multiple Living  2 0 0 0 1 1 0 0 0 0     # Outcome Date GA Lbr Len/2nd Weight Sex Delivery Anes PTL Lv  2 Current           1 SAB 2011              Past Surgical History: Past Surgical History  Procedure Laterality Date  . Wisdom tooth extraction      Family History: Family History  Problem Relation Age of Onset  . Hypertension Mother   . Hypertension Father   . Diabetes Paternal Grandmother     Social History: History  Substance Use Topics  . Smoking status: Former Smoker -- 0.10 packs/day for 1 years    Quit date: 07/26/2014  . Smokeless tobacco: Never Used  . Alcohol Use: No    Allergies:  Allergies  Allergen Reactions  . Grapeseed Extract [Nutritional Supplements]     muscadine     Meds:  Prescriptions prior to admission  Medication Sig Dispense Refill Last Dose  . cetirizine (ZYRTEC) 10 MG tablet Take 10 mg by mouth daily.   01/08/2015 at Unknown time  . flintstones complete (FLINTSTONES) 60 MG chewable tablet Chew 2 tablets by mouth daily.    01/09/2015 at Unknown time    ROS:  Review of Systems  Constitutional: Negative for fever and chills.  Cardiovascular: Negative for chest pain.  Gastrointestinal: Positive for abdominal pain. Negative for heartburn, nausea, vomiting, diarrhea and constipation.  Genitourinary: Negative for dysuria, urgency and frequency.  Neurological: Negative for headaches.    Physical Exam  Blood pressure 103/63, pulse 100, temperature 97.7 F (36.5 C), temperature source Oral, resp. rate 18, last menstrual period 05/30/2014. GENERAL: Well-developed, well-nourished female in no acute distress.  HEENT: normocephalic HEART: normal rate RESP: normal effort ABDOMEN: Soft, non-tender, gravid appropriate for gestational age EXTREMITIES: Nontender, no edema NEURO: alert and oriented  Dilation: Closed Effacement (%): Thick Cervical Position: Posterior Exam by:: Clayton LefortL. Kirby CNM  FHT:  Baseline 135, moderate variability, accelerations present, no decelerations Contractions: None on toco or to palpation   Labs: Results for orders placed or performed during the hospital encounter of 01/09/15 (from the past 24 hour(s))  Urinalysis, Routine w reflex microscopic     Status: Abnormal   Collection Time: 01/09/15  2:30 PM  Result Value  Ref Range   Color, Urine YELLOW YELLOW   APPearance CLEAR CLEAR   Specific Gravity, Urine 1.025 1.005 - 1.030   pH 5.5 5.0 - 8.0   Glucose, UA NEGATIVE NEGATIVE mg/dL   Hgb urine dipstick NEGATIVE NEGATIVE   Bilirubin Urine NEGATIVE NEGATIVE   Ketones, ur NEGATIVE NEGATIVE mg/dL   Protein, ur NEGATIVE NEGATIVE mg/dL   Urobilinogen, UA 0.2 0.0 - 1.0 mg/dL   Nitrite NEGATIVE NEGATIVE   Leukocytes, UA TRACE  (A) NEGATIVE  Urine microscopic-add on     Status: None   Collection Time: 01/09/15  2:30 PM  Result Value Ref Range   Squamous Epithelial / LPF RARE RARE   WBC, UA 0-2 <3 WBC/hpf   Bacteria, UA RARE RARE    Assessment: 1. Braxton Hicks contractions   2. Pelvic pressure in pregnancy, antepartum, third trimester     Plan: Discharge home PTL precautions and fetal kick counts Increase PO fluids      Follow-up Information    Follow up with [facility]   Specialty:  Obstetrics and Gynecology   Why:  As scheduled   Contact information:   [address] [place] [address] [phone]      Follow up with [facility].   Why:  As needed for emergencies   Contact information:   [address] [id number] mc [place] [address] [phone]       Medication List    TAKE these medications        cetirizine 10 MG tablet  Commonly known as:  ZYRTEC  Take 10 mg by mouth daily.     flintstones complete 60 MG chewable tablet  Chew 2 tablets by mouth daily.        Sharen Counter Certified Nurse-Midwife 01/09/2015 4:36 PM

## 2015-01-09 NOTE — MAU Note (Signed)
Pt states she began having abd tightening yesterday, continues today.  Also had pelvic pressure last night & today.  Denies leaking or bleeding.

## 2015-01-09 NOTE — Discharge Instructions (Signed)
Braxton Hicks Contractions °Contractions of the uterus can occur throughout pregnancy. Contractions are not always a sign that you are in labor.  °WHAT ARE BRAXTON HICKS CONTRACTIONS?  °Contractions that occur before labor are called Braxton Hicks contractions, or false labor. Toward the end of pregnancy (32-34 weeks), these contractions can develop more often and may become more forceful. This is not true labor because these contractions do not result in opening (dilatation) and thinning of the cervix. They are sometimes difficult to tell apart from true labor because these contractions can be forceful and people have different pain tolerances. You should not feel embarrassed if you go to the hospital with false labor. Sometimes, the only way to tell if you are in true labor is for your health care provider to look for changes in the cervix. °If there are no prenatal problems or other health problems associated with the pregnancy, it is completely safe to be sent home with false labor and await the onset of true labor. °HOW CAN YOU TELL THE DIFFERENCE BETWEEN TRUE AND FALSE LABOR? °False Labor °· The contractions of false labor are usually shorter and not as hard as those of true labor.   °· The contractions are usually irregular.   °· The contractions are often felt in the front of the lower abdomen and in the groin.   °· The contractions may go away when you walk around or change positions while lying down.   °· The contractions get weaker and are shorter lasting as time goes on.   °· The contractions do not usually become progressively stronger, regular, and closer together as with true labor.   °True Labor °· Contractions in true labor last 30-70 seconds, become very regular, usually become more intense, and increase in frequency.   °· The contractions do not go away with walking.   °· The discomfort is usually felt in the top of the uterus and spreads to the lower abdomen and low back.   °· True labor can be  determined by your health care provider with an exam. This will show that the cervix is dilating and getting thinner.   °WHAT TO REMEMBER °· Keep up with your usual exercises and follow other instructions given by your health care provider.   °· Take medicines as directed by your health care provider.   °· Keep your regular prenatal appointments.   °· Eat and drink lightly if you think you are going into labor.   °· If Braxton Hicks contractions are making you uncomfortable:   °¨ Change your position from lying down or resting to walking, or from walking to resting.   °¨ Sit and rest in a tub of warm water.   °¨ Drink 2-3 glasses of water. Dehydration may cause these contractions.   °¨ Do slow and deep breathing several times an hour.   °WHEN SHOULD I SEEK IMMEDIATE MEDICAL CARE? °Seek immediate medical care if: °· Your contractions become stronger, more regular, and closer together.   °· You have fluid leaking or gushing from your vagina.   °· You have a fever.   °· You pass blood-tinged mucus.   °· You have vaginal bleeding.   °· You have continuous abdominal pain.   °· You have low back pain that you never had before.   °· You feel your baby's head pushing down and causing pelvic pressure.   °· Your baby is not moving as much as it used to.   °Document Released: 09/22/2005 Document Revised: 09/27/2013 Document Reviewed: 07/04/2013 °[employer]® Patient Information ©2015 [employer]. This information is not intended to replace advice given to you by your health care   provider. Make sure you discuss any questions you have with your health care provider. ° °

## 2015-01-10 LAB — CULTURE, OB URINE
Colony Count: NO GROWTH
Culture: NO GROWTH

## 2015-01-17 ENCOUNTER — Encounter: Payer: Self-pay | Admitting: Advanced Practice Midwife

## 2015-01-17 ENCOUNTER — Encounter: Payer: Medicaid Other | Admitting: Advanced Practice Midwife

## 2015-01-24 ENCOUNTER — Encounter: Payer: Self-pay | Admitting: General Practice

## 2015-02-06 ENCOUNTER — Encounter: Payer: Self-pay | Admitting: Obstetrics and Gynecology

## 2015-02-06 ENCOUNTER — Ambulatory Visit (INDEPENDENT_AMBULATORY_CARE_PROVIDER_SITE_OTHER): Payer: Medicaid Other | Admitting: Obstetrics and Gynecology

## 2015-02-06 VITALS — BP 120/84 | HR 90 | Temp 98.0°F | Wt 138.7 lb

## 2015-02-06 DIAGNOSIS — F419 Anxiety disorder, unspecified: Secondary | ICD-10-CM

## 2015-02-06 DIAGNOSIS — Z3493 Encounter for supervision of normal pregnancy, unspecified, third trimester: Secondary | ICD-10-CM

## 2015-02-06 LAB — POCT URINALYSIS DIP (DEVICE)
Bilirubin Urine: NEGATIVE
GLUCOSE, UA: NEGATIVE mg/dL
HGB URINE DIPSTICK: NEGATIVE
Ketones, ur: NEGATIVE mg/dL
Nitrite: NEGATIVE
PROTEIN: NEGATIVE mg/dL
SPECIFIC GRAVITY, URINE: 1.025 (ref 1.005–1.030)
Urobilinogen, UA: 2 mg/dL — ABNORMAL HIGH (ref 0.0–1.0)
pH: 7 (ref 5.0–8.0)

## 2015-02-06 NOTE — Progress Notes (Signed)
C/o pelvic pressure and occasional sharp pains in abdomen.

## 2015-02-06 NOTE — Progress Notes (Signed)
Patient is doing well without complaints. FM/PTL precautions reviewed. Cultures next visit 

## 2015-02-23 ENCOUNTER — Ambulatory Visit (INDEPENDENT_AMBULATORY_CARE_PROVIDER_SITE_OTHER): Payer: Medicaid Other | Admitting: Obstetrics and Gynecology

## 2015-02-23 VITALS — BP 116/78 | HR 78 | Temp 98.6°F | Wt 144.0 lb

## 2015-02-23 DIAGNOSIS — Z3493 Encounter for supervision of normal pregnancy, unspecified, third trimester: Secondary | ICD-10-CM

## 2015-02-23 LAB — POCT URINALYSIS DIP (DEVICE)
Bilirubin Urine: NEGATIVE
Glucose, UA: NEGATIVE mg/dL
Hgb urine dipstick: NEGATIVE
KETONES UR: NEGATIVE mg/dL
NITRITE: NEGATIVE
PROTEIN: NEGATIVE mg/dL
Specific Gravity, Urine: 1.015 (ref 1.005–1.030)
Urobilinogen, UA: 0.2 mg/dL (ref 0.0–1.0)
pH: 7 (ref 5.0–8.0)

## 2015-02-23 LAB — OB RESULTS CONSOLE GC/CHLAMYDIA
Chlamydia: NEGATIVE
Gonorrhea: NEGATIVE

## 2015-02-23 LAB — OB RESULTS CONSOLE GBS: GBS: NEGATIVE

## 2015-02-23 NOTE — Progress Notes (Signed)
Pt c/o constant pressure on pelvic but no pain.

## 2015-02-23 NOTE — Progress Notes (Signed)
Doing well. Mother supportive. 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 Cultures sent. Labor and FM precautions. Discussed LARC, pain management in labor.

## 2015-02-23 NOTE — Patient Instructions (Signed)
Third Trimester of Pregnancy The third trimester is from week 29 through week 42, months 7 through 9. The third trimester is a time when the fetus is growing rapidly. At the end of the ninth month, the fetus is about 20 inches in length and weighs 6-10 pounds.  BODY CHANGES Your body goes through many changes during pregnancy. The changes vary from woman to woman.   Your weight will continue to increase. You can expect to gain 25-35 pounds (11-16 kg) by the end of the pregnancy.  You may begin to get stretch marks on your hips, abdomen, and breasts.  You may urinate more often because the fetus is moving lower into your pelvis and pressing on your bladder.  You may develop or continue to have heartburn as a result of your pregnancy.  You may develop constipation because certain hormones are causing the muscles that push waste through your intestines to slow down.  You may develop hemorrhoids or swollen, bulging veins (varicose veins).  You may have pelvic pain because of the weight gain and pregnancy hormones relaxing your joints between the bones in your pelvis. Backaches may result from overexertion of the muscles supporting your posture.  You may have changes in your hair. These can include thickening of your hair, rapid growth, and changes in texture. Some women also have hair loss during or after pregnancy, or hair that feels dry or thin. Your hair will most likely return to normal after your baby is born.  Your breasts will continue to grow and be tender. A yellow discharge may leak from your breasts called colostrum.  Your belly button may stick out.  You may feel short of breath because of your expanding uterus.  You may notice the fetus "dropping," or moving lower in your abdomen.  You may have a bloody mucus discharge. This usually occurs a few days to a week before labor begins.  Your cervix becomes thin and soft (effaced) near your due date. WHAT TO EXPECT AT YOUR PRENATAL  EXAMS  You will have prenatal exams every 2 weeks until week 36. Then, you will have weekly prenatal exams. During a routine prenatal visit:  You will be weighed to make sure you and the fetus are growing normally.  Your blood pressure is taken.  Your abdomen will be measured to track your baby's growth.  The fetal heartbeat will be listened to.  Any test results from the previous visit will be discussed.  You may have a cervical check near your due date to see if you have effaced. At around 36 weeks, your caregiver will check your cervix. At the same time, your caregiver will also perform a test on the secretions of the vaginal tissue. This test is to determine if a type of bacteria, Group B streptococcus, is present. Your caregiver will explain this further. Your caregiver may ask you:  What your birth plan is.  How you are feeling.  If you are feeling the baby move.  If you have had any abnormal symptoms, such as leaking fluid, bleeding, severe headaches, or abdominal cramping.  If you have any questions. Other tests or screenings that may be performed during your third trimester include:  Blood tests that check for low iron levels (anemia).  Fetal testing to check the health, activity level, and growth of the fetus. Testing is done if you have certain medical conditions or if there are problems during the pregnancy. FALSE LABOR You may feel small, irregular contractions that   eventually go away. These are called Braxton Hicks contractions, or false labor. Contractions may last for hours, days, or even weeks before true labor sets in. If contractions come at regular intervals, intensify, or become painful, it is best to be seen by your caregiver.  SIGNS OF LABOR   Menstrual-like cramps.  Contractions that are 5 minutes apart or less.  Contractions that start on the top of the uterus and spread down to the lower abdomen and back.  A sense of increased pelvic pressure or back  pain.  A watery or bloody mucus discharge that comes from the vagina. If you have any of these signs before the 37th week of pregnancy, call your caregiver right away. You need to go to the hospital to get checked immediately. HOME CARE INSTRUCTIONS   Avoid all smoking, herbs, alcohol, and unprescribed drugs. These chemicals affect the formation and growth of the baby.  Follow your caregiver's instructions regarding medicine use. There are medicines that are either safe or unsafe to take during pregnancy.  Exercise only as directed by your caregiver. Experiencing uterine cramps is a good sign to stop exercising.  Continue to eat regular, healthy meals.  Wear a good support bra for breast tenderness.  Do not use hot tubs, steam rooms, or saunas.  Wear your seat belt at all times when driving.  Avoid raw meat, uncooked cheese, cat litter boxes, and soil used by cats. These carry germs that can cause birth defects in the baby.  Take your prenatal vitamins.  Try taking a stool softener (if your caregiver approves) if you develop constipation. Eat more high-fiber foods, such as fresh vegetables or fruit and whole grains. Drink plenty of fluids to keep your urine clear or pale yellow.  Take warm sitz baths to soothe any pain or discomfort caused by hemorrhoids. Use hemorrhoid cream if your caregiver approves.  If you develop varicose veins, wear support hose. Elevate your feet for 15 minutes, 3-4 times a day. Limit salt in your diet.  Avoid heavy lifting, wear low heal shoes, and practice good posture.  Rest a lot with your legs elevated if you have leg cramps or low back pain.  Visit your dentist if you have not gone during your pregnancy. Use a soft toothbrush to brush your teeth and be gentle when you floss.  A sexual relationship may be continued unless your caregiver directs you otherwise.  Do not travel far distances unless it is absolutely necessary and only with the approval  of your caregiver.  Take prenatal classes to understand, practice, and ask questions about the labor and delivery.  Make a trial run to the hospital.  Pack your hospital bag.  Prepare the baby's nursery.  Continue to go to all your prenatal visits as directed by your caregiver. SEEK MEDICAL CARE IF:  You are unsure if you are in labor or if your water has broken.  You have dizziness.  You have mild pelvic cramps, pelvic pressure, or nagging pain in your abdominal area.  You have persistent nausea, vomiting, or diarrhea.  You have a bad smelling vaginal discharge.  You have pain with urination. SEEK IMMEDIATE MEDICAL CARE IF:   You have a fever.  You are leaking fluid from your vagina.  You have spotting or bleeding from your vagina.  You have severe abdominal cramping or pain.  You have rapid weight loss or gain.  You have shortness of breath with chest pain.  You notice sudden or extreme swelling   of your face, hands, ankles, feet, or legs.  You have not felt your baby move in over an hour.  You have severe headaches that do not go away with medicine.  You have vision changes. Document Released: 09/16/2001 Document Revised: 09/27/2013 Document Reviewed: 11/23/2012 [employer] Patient Information 2015 [employer]. This information is not intended to replace advice given to you by your health care provider. Make sure you discuss any questions you have with your health care provider.  

## 2015-02-24 LAB — GC/CHLAMYDIA PROBE AMP
CT PROBE, AMP APTIMA: NEGATIVE
GC Probe RNA: NEGATIVE

## 2015-02-25 LAB — CULTURE, BETA STREP (GROUP B ONLY)

## 2015-03-02 ENCOUNTER — Encounter: Payer: Self-pay | Admitting: Obstetrics & Gynecology

## 2015-03-02 ENCOUNTER — Ambulatory Visit (INDEPENDENT_AMBULATORY_CARE_PROVIDER_SITE_OTHER): Payer: Medicaid Other | Admitting: Obstetrics & Gynecology

## 2015-03-02 VITALS — BP 124/84 | HR 72 | Temp 98.2°F | Wt 143.2 lb

## 2015-03-02 DIAGNOSIS — Z3493 Encounter for supervision of normal pregnancy, unspecified, third trimester: Secondary | ICD-10-CM

## 2015-03-02 LAB — POCT URINALYSIS DIP (DEVICE)
BILIRUBIN URINE: NEGATIVE
Glucose, UA: NEGATIVE mg/dL
Hgb urine dipstick: NEGATIVE
KETONES UR: NEGATIVE mg/dL
Nitrite: NEGATIVE
Protein, ur: NEGATIVE mg/dL
SPECIFIC GRAVITY, URINE: 1.015 (ref 1.005–1.030)
UROBILINOGEN UA: 1 mg/dL (ref 0.0–1.0)
pH: 7 (ref 5.0–8.0)

## 2015-03-02 NOTE — Progress Notes (Signed)
GBS, GC/Chlam negative.  No other complaints or concerns.  Labor and fetal movement precautions reviewed.

## 2015-03-02 NOTE — Patient Instructions (Signed)
Return to clinic for any obstetric concerns or go to MAU for evaluation  

## 2015-03-09 ENCOUNTER — Ambulatory Visit (INDEPENDENT_AMBULATORY_CARE_PROVIDER_SITE_OTHER): Payer: 59 | Admitting: Obstetrics & Gynecology

## 2015-03-09 ENCOUNTER — Encounter: Payer: Self-pay | Admitting: Obstetrics & Gynecology

## 2015-03-09 VITALS — BP 112/77 | HR 75 | Temp 98.5°F | Wt 143.4 lb

## 2015-03-09 DIAGNOSIS — Z3493 Encounter for supervision of normal pregnancy, unspecified, third trimester: Secondary | ICD-10-CM

## 2015-03-09 LAB — POCT URINALYSIS DIP (DEVICE)
Bilirubin Urine: NEGATIVE
Glucose, UA: NEGATIVE mg/dL
Hgb urine dipstick: NEGATIVE
Ketones, ur: NEGATIVE mg/dL
Leukocytes, UA: NEGATIVE
Nitrite: NEGATIVE
Protein, ur: NEGATIVE mg/dL
Specific Gravity, Urine: 1.01 (ref 1.005–1.030)
Urobilinogen, UA: 0.2 mg/dL (ref 0.0–1.0)
pH: 7 (ref 5.0–8.0)

## 2015-03-09 NOTE — Progress Notes (Signed)
Subjective: some BHC   Suzanne Goodwin is a 19 y.o. G2P0010 at 7472w3d being seen today for her obstetrical visit.  Patient reports occasional contractions.   Contractions: Contractions: Irregular.   Vaginal Bleeding Vag. Bleeding: None.   Fetal Movement: Movement: Present.   Denies contractions, vaginal bleeding or leaking of fluid.  Reports good fetal movement.  The following portions of the patient's history were reviewed and updated as appropriate: allergies, current medications, past family history, past medical history, past social history, past surgical history and problem list.   Objective:  BP 112/77 mmHg  Pulse 75  Temp(Src) 98.5 F (36.9 C)  Wt 143 lb 6.4 oz (65.046 kg)  LMP 05/30/2014 (Approximate) Fetal Heart Rate: Fetal Heart Rate (bpm): 124  Fundal Height:    Fetal Movement: Movement: Present  Fetal Presentation:     Abdomen: Soft, gravid, appropriate for gestational age.  Pain/Pressure: Pain/Pressure: Present     Vaginal: Vaginal Bleeding Vag. Bleeding: None   Discharge: Vag D/C Character: White  Cervix: Dilation:   Effacement:   Station:    Extremities: Edema: Edema: Trace   Urinalysis: Protein: Urine Protein: Negative Glucose: Urine Glucose: Negative Results for orders placed or performed in visit on 03/09/15 (from the past 24 hour(s))  POCT urinalysis dip (device)     Status: None   Collection Time: 03/09/15  8:15 AM  Result Value Ref Range   Glucose, UA NEGATIVE NEGATIVE mg/dL   Bilirubin Urine NEGATIVE NEGATIVE   Ketones, ur NEGATIVE NEGATIVE mg/dL   Specific Gravity, Urine 1.010 1.005 - 1.030   Hgb urine dipstick NEGATIVE NEGATIVE   pH 7.0 5.0 - 8.0   Protein, ur NEGATIVE NEGATIVE mg/dL   Urobilinogen, UA 0.2 0.0 - 1.0 mg/dL   Nitrite NEGATIVE NEGATIVE   Leukocytes, UA NEGATIVE NEGATIVE     Assessment and Plan:   Pregnancy:  G2P0010 at 3572w3d  There are no diagnoses linked to this encounter.  Preterm labor symptoms: vaginal bleeding,  contractions and leaking of fluid reviewed in detail.  Fetal movement precautions reviewed.  Follow up in 1 week. Will not work past EDD.   Adam PhenixJames G Jayce Boyko, MD

## 2015-03-09 NOTE — Patient Instructions (Signed)

## 2015-03-09 NOTE — Progress Notes (Signed)
Breastfeeding Tip of the week  Pt has no complaints

## 2015-03-10 ENCOUNTER — Encounter (HOSPITAL_COMMUNITY): Payer: Self-pay

## 2015-03-10 ENCOUNTER — Inpatient Hospital Stay (HOSPITAL_COMMUNITY)
Admission: AD | Admit: 2015-03-10 | Discharge: 2015-03-10 | Disposition: A | Discharge status: Home / Self Care | Payer: 59 | Source: Ambulatory Visit | Attending: Obstetrics and Gynecology | Admitting: Obstetrics and Gynecology

## 2015-03-10 DIAGNOSIS — O26893 Other specified pregnancy related conditions, third trimester: Secondary | ICD-10-CM | POA: Diagnosis not present

## 2015-03-10 DIAGNOSIS — Z3A39 39 weeks gestation of pregnancy: Secondary | ICD-10-CM | POA: Diagnosis not present

## 2015-03-10 DIAGNOSIS — O9989 Other specified diseases and conditions complicating pregnancy, childbirth and the puerperium: Secondary | ICD-10-CM | POA: Insufficient documentation

## 2015-03-10 DIAGNOSIS — Z87891 Personal history of nicotine dependence: Secondary | ICD-10-CM | POA: Insufficient documentation

## 2015-03-10 DIAGNOSIS — N898 Other specified noninflammatory disorders of vagina: Secondary | ICD-10-CM | POA: Insufficient documentation

## 2015-03-10 DIAGNOSIS — R109 Unspecified abdominal pain: Secondary | ICD-10-CM

## 2015-03-10 DIAGNOSIS — O26899 Other specified pregnancy related conditions, unspecified trimester: Secondary | ICD-10-CM

## 2015-03-10 LAB — WET PREP, GENITAL
Clue Cells Wet Prep HPF POC: NONE SEEN
TRICH WET PREP: NONE SEEN
Yeast Wet Prep HPF POC: NONE SEEN

## 2015-03-10 NOTE — MAU Note (Signed)
Pt presents complaining of an increase of vaginal discharge with mucous. Denies contractions. Denies vaginal bleeding.

## 2015-03-10 NOTE — MAU Provider Note (Signed)
  History     CSN: [id number]  Arrival date and time: 03/10/15 1101   None     Chief Complaint  Patient presents with  . Vaginal Discharge   HPI [name] is a 19 y.o. G2P0010 at 2269w4d presenting with complaint of possible rupture of membranes. States she doesn't actually think her water broke but wants to make sure that she didn't pass her mucus plug. Reports this AM, went to the bathroom and had a lot of thin, white discharge. Was presenting again when she went to the bathroom again. No thin, clear fluid. No vaginal bleeding. No vaginal irritation or pain. Feeling regular fetal movement. Feeling some intermittent contractions over the last few days but nothing sustained or significantly painful.   OB History    Gravida Para Term Preterm AB TAB SAB Ectopic Multiple Living   2 0 0 0 1 0 1 0 0 0       Past Medical History  Diagnosis Date  . Anxiety     Past Surgical History  Procedure Laterality Date  . Wisdom tooth extraction      Family History  Problem Relation Age of Onset  . Hypertension Mother   . Hypertension Father   . Diabetes Paternal Grandmother     History  Substance Use Topics  . Smoking status: Former Smoker -- 0.10 packs/day for 1 years    Quit date: 07/26/2014  . Smokeless tobacco: Never Used  . Alcohol Use: No    Allergies:  Allergies  Allergen Reactions  . Grapeseed Extract [Nutritional Supplements]     muscadine    Prescriptions prior to admission  Medication Sig Dispense Refill Last Dose  . cetirizine (ZYRTEC) 10 MG tablet Take 10 mg by mouth daily.   Past Month at Unknown time  . flintstones complete (FLINTSTONES) 60 MG chewable tablet Chew 2 tablets by mouth daily.    03/09/2015 at Unknown time    Review of Systems  Constitutional: Negative for fever.  Genitourinary: Negative for dysuria.  Skin: Negative for itching and rash.   Physical Exam   Blood pressure 138/89, pulse 105, temperature 98 F (36.7 C), temperature source Oral,  resp. rate 18, last menstrual period 05/30/2014.  Physical Exam  Constitutional: She appears well-nourished. No distress.  Genitourinary:  External genitalia normal. Vaginal vault with moderate amount of thin, white discharge with some frothy quality. No pooling.   SVE closed, thick    MAU Course  Procedures  MDM  18 y.o. G2P0010 at 4869w4d with vaginal discharge x 1 day. No leakage of fluid. FHT reassuring, cat I.   Ferning negative  Wet prep:   Assessment and Plan  A: 19 yo G2P0010 at 2869w4d with vaginal discharge. Pooling and ferning negative and no cervical change. Not in active labor. Wet prep with only moderate WBC. No clue cells  P: No rupture of membranes or vaginal infection      Pt d/c'd to home with labor precautions   Patient history, exam, assessment and plan discussed with Suzanne Goodwin, CNM  Suzanne Goodwin 03/10/2015, 12:27 PM   I spoke with and examined patient and agree with resident/PA/SNM's note and plan of care.  Keep next appt on Friday as scheduled.  Suzanne Goodwin, CNM, [facility] 03/10/2015 12:52 PM

## 2015-03-10 NOTE — Discharge Instructions (Signed)
Braxton Hicks Contractions °Contractions of the uterus can occur throughout pregnancy. Contractions are not always a sign that you are in labor.  °WHAT ARE BRAXTON HICKS CONTRACTIONS?  °Contractions that occur before labor are called Braxton Hicks contractions, or false labor. Toward the end of pregnancy (32-34 weeks), these contractions can develop more often and may become more forceful. This is not true labor because these contractions do not result in opening (dilatation) and thinning of the cervix. They are sometimes difficult to tell apart from true labor because these contractions can be forceful and people have different pain tolerances. You should not feel embarrassed if you go to the hospital with false labor. Sometimes, the only way to tell if you are in true labor is for your health care provider to look for changes in the cervix. °If there are no prenatal problems or other health problems associated with the pregnancy, it is completely safe to be sent home with false labor and await the onset of true labor. °HOW CAN YOU TELL THE DIFFERENCE BETWEEN TRUE AND FALSE LABOR? °False Labor °· The contractions of false labor are usually shorter and not as hard as those of true labor.   °· The contractions are usually irregular.   °· The contractions are often felt in the front of the lower abdomen and in the groin.   °· The contractions may go away when you walk around or change positions while lying down.   °· The contractions get weaker and are shorter lasting as time goes on.   °· The contractions do not usually become progressively stronger, regular, and closer together as with true labor.   °True Labor °· Contractions in true labor last 30-70 seconds, become very regular, usually become more intense, and increase in frequency.   °· The contractions do not go away with walking.   °· The discomfort is usually felt in the top of the uterus and spreads to the lower abdomen and low back.   °· True labor can be  determined by your health care provider with an exam. This will show that the cervix is dilating and getting thinner.   °WHAT TO REMEMBER °· Keep up with your usual exercises and follow other instructions given by your health care provider.   °· Take medicines as directed by your health care provider.   °· Keep your regular prenatal appointments.   °· Eat and drink lightly if you think you are going into labor.   °· If Braxton Hicks contractions are making you uncomfortable:   °¨ Change your position from lying down or resting to walking, or from walking to resting.   °¨ Sit and rest in a tub of warm water.   °¨ Drink 2-3 glasses of water. Dehydration may cause these contractions.   °¨ Do slow and deep breathing several times an hour.   °WHEN SHOULD I SEEK IMMEDIATE MEDICAL CARE? °Seek immediate medical care if: °· Your contractions become stronger, more regular, and closer together.   °· You have fluid leaking or gushing from your vagina.   °· You have a fever.   °· You pass blood-tinged mucus.   °· You have vaginal bleeding.   °· You have continuous abdominal pain.   °· You have low back pain that you never had before.   °· You feel your baby's head pushing down and causing pelvic pressure.   °· Your baby is not moving as much as it used to.   °Document Released: 09/22/2005 Document Revised: 09/27/2013 Document Reviewed: 07/04/2013 °[employer]® Patient Information ©2015 [employer]. This information is not intended to replace advice given to you by your health care   provider. Make sure you discuss any questions you have with your health care provider. ° °

## 2015-03-10 NOTE — MAU Note (Signed)
Urine in lab 

## 2015-03-12 ENCOUNTER — Encounter: Payer: Self-pay | Admitting: Obstetrics & Gynecology

## 2015-03-12 ENCOUNTER — Inpatient Hospital Stay (HOSPITAL_COMMUNITY)
Admission: AD | Admit: 2015-03-12 | Discharge: 2015-03-12 | Disposition: A | Discharge status: Home / Self Care | Payer: 59 | Source: Ambulatory Visit | Attending: Obstetrics & Gynecology | Admitting: Obstetrics & Gynecology

## 2015-03-12 ENCOUNTER — Encounter (HOSPITAL_COMMUNITY): Payer: Self-pay | Admitting: *Deleted

## 2015-03-12 DIAGNOSIS — Z3A40 40 weeks gestation of pregnancy: Secondary | ICD-10-CM

## 2015-03-12 DIAGNOSIS — O9989 Other specified diseases and conditions complicating pregnancy, childbirth and the puerperium: Secondary | ICD-10-CM | POA: Insufficient documentation

## 2015-03-12 NOTE — MAU Note (Signed)
Pt states severe uc's started about an hour ago, denies bleeding or LOF.

## 2015-03-12 NOTE — Discharge Instructions (Signed)
Braxton Hicks Contractions °Contractions of the uterus can occur throughout pregnancy. Contractions are not always a sign that you are in labor.  °WHAT ARE BRAXTON HICKS CONTRACTIONS?  °Contractions that occur before labor are called Braxton Hicks contractions, or false labor. Toward the end of pregnancy (32-34 weeks), these contractions can develop more often and may become more forceful. This is not true labor because these contractions do not result in opening (dilatation) and thinning of the cervix. They are sometimes difficult to tell apart from true labor because these contractions can be forceful and people have different pain tolerances. You should not feel embarrassed if you go to the hospital with false labor. Sometimes, the only way to tell if you are in true labor is for your health care provider to look for changes in the cervix. °If there are no prenatal problems or other health problems associated with the pregnancy, it is completely safe to be sent home with false labor and await the onset of true labor. °HOW CAN YOU TELL THE DIFFERENCE BETWEEN TRUE AND FALSE LABOR? °False Labor °· The contractions of false labor are usually shorter and not as hard as those of true labor.   °· The contractions are usually irregular.   °· The contractions are often felt in the front of the lower abdomen and in the groin.   °· The contractions may go away when you walk around or change positions while lying down.   °· The contractions get weaker and are shorter lasting as time goes on.   °· The contractions do not usually become progressively stronger, regular, and closer together as with true labor.   °True Labor °· Contractions in true labor last 30-70 seconds, become very regular, usually become more intense, and increase in frequency.   °· The contractions do not go away with walking.   °· The discomfort is usually felt in the top of the uterus and spreads to the lower abdomen and low back.   °· True labor can be  determined by your health care provider with an exam. This will show that the cervix is dilating and getting thinner.   °WHAT TO REMEMBER °· Keep up with your usual exercises and follow other instructions given by your health care provider.   °· Take medicines as directed by your health care provider.   °· Keep your regular prenatal appointments.   °· Eat and drink lightly if you think you are going into labor.   °· If Braxton Hicks contractions are making you uncomfortable:   °¨ Change your position from lying down or resting to walking, or from walking to resting.   °¨ Sit and rest in a tub of warm water.   °¨ Drink 2-3 glasses of water. Dehydration may cause these contractions.   °¨ Do slow and deep breathing several times an hour.   °WHEN SHOULD I SEEK IMMEDIATE MEDICAL CARE? °Seek immediate medical care if: °· Your contractions become stronger, more regular, and closer together.   °· You have fluid leaking or gushing from your vagina.   °· You have a fever.    °· You have vaginal bleeding.   °· You have continuous abdominal pain.   °· You have low back pain that you never had before.   °· You feel your baby's head pushing down and causing pelvic pressure.   °· Your baby is not moving as much as it used to.   °Document Released: 09/22/2005 Document Revised: 09/27/2013 Document Reviewed: 07/04/2013 °Exit[employer]® Patient Information ©2015 [employer]. This information is not intended to replace advice given to you by your health care provider. Make sure you discuss any   questions you have with your health care provider. ° °

## 2015-03-15 ENCOUNTER — Encounter (HOSPITAL_COMMUNITY): Payer: Self-pay | Admitting: *Deleted

## 2015-03-15 ENCOUNTER — Inpatient Hospital Stay (HOSPITAL_COMMUNITY)
Admission: AD | Admit: 2015-03-15 | Discharge: 2015-03-15 | Disposition: A | Discharge status: Home / Self Care | Payer: 59 | Source: Ambulatory Visit | Attending: Obstetrics & Gynecology | Admitting: Obstetrics & Gynecology

## 2015-03-15 DIAGNOSIS — Z87891 Personal history of nicotine dependence: Secondary | ICD-10-CM

## 2015-03-15 DIAGNOSIS — Z833 Family history of diabetes mellitus: Secondary | ICD-10-CM

## 2015-03-15 DIAGNOSIS — Z3A40 40 weeks gestation of pregnancy: Secondary | ICD-10-CM | POA: Diagnosis present

## 2015-03-15 DIAGNOSIS — Z8249 Family history of ischemic heart disease and other diseases of the circulatory system: Secondary | ICD-10-CM

## 2015-03-15 DIAGNOSIS — O99344 Other mental disorders complicating childbirth: Secondary | ICD-10-CM | POA: Diagnosis present

## 2015-03-15 DIAGNOSIS — F419 Anxiety disorder, unspecified: Secondary | ICD-10-CM | POA: Diagnosis present

## 2015-03-15 NOTE — MAU Note (Signed)
Pt has been having ctx on and off all night. Noticed some fluid leaking last night and this morning. Good fetal  Movement reported.

## 2015-03-15 NOTE — Discharge Instructions (Signed)
Braxton Hicks Contractions °Contractions of the uterus can occur throughout pregnancy. Contractions are not always a sign that you are in labor.  °WHAT ARE BRAXTON HICKS CONTRACTIONS?  °Contractions that occur before labor are called Braxton Hicks contractions, or false labor. Toward the end of pregnancy (32-34 weeks), these contractions can develop more often and may become more forceful. This is not true labor because these contractions do not result in opening (dilatation) and thinning of the cervix. They are sometimes difficult to tell apart from true labor because these contractions can be forceful and people have different pain tolerances. You should not feel embarrassed if you go to the hospital with false labor. Sometimes, the only way to tell if you are in true labor is for your health care provider to look for changes in the cervix. °If there are no prenatal problems or other health problems associated with the pregnancy, it is completely safe to be sent home with false labor and await the onset of true labor. °HOW CAN YOU TELL THE DIFFERENCE BETWEEN TRUE AND FALSE LABOR? °False Labor °· The contractions of false labor are usually shorter and not as hard as those of true labor.   °· The contractions are usually irregular.   °· The contractions are often felt in the front of the lower abdomen and in the groin.   °· The contractions may go away when you walk around or change positions while lying down.   °· The contractions get weaker and are shorter lasting as time goes on.   °· The contractions do not usually become progressively stronger, regular, and closer together as with true labor.   °True Labor °1. Contractions in true labor last 30-70 seconds, become very regular, usually become more intense, and increase in frequency.   °2. The contractions do not go away with walking.   °3. The discomfort is usually felt in the top of the uterus and spreads to the lower abdomen and low back.   °4. True labor can  be determined by your health care provider with an exam. This will show that the cervix is dilating and getting thinner.   °WHAT TO REMEMBER °· Keep up with your usual exercises and follow other instructions given by your health care provider.   °· Take medicines as directed by your health care provider.   °· Keep your regular prenatal appointments.   °· Eat and drink lightly if you think you are going into labor.   °· If Braxton Hicks contractions are making you uncomfortable:   °· Change your position from lying down or resting to walking, or from walking to resting.   °· Sit and rest in a tub of warm water.   °· Drink 2-3 glasses of water. Dehydration may cause these contractions.   °· Do slow and deep breathing several times an hour.   °WHEN SHOULD I SEEK IMMEDIATE MEDICAL CARE? °Seek immediate medical care if: °· Your contractions become stronger, more regular, and closer together.   °· You have fluid leaking or gushing from your vagina.   °· You have a fever.   °· You pass blood-tinged mucus.   °· You have vaginal bleeding.   °· You have continuous abdominal pain.   °· You have low back pain that you never had before.   °· You feel your baby's head pushing down and causing pelvic pressure.   °· Your baby is not moving as much as it used to.   °Document Released: 09/22/2005 Document Revised: 09/27/2013 Document Reviewed: 07/04/2013 °[employer]® Patient Information ©2015 [employer]. This information is not intended to replace advice given to you by your health care   provider. Make sure you discuss any questions you have with your health care provider. ° °Fetal Movement Counts °Patient Name: __________________________________________________ Patient Due Date: ____________________ °Performing a fetal movement count is highly recommended in high-risk pregnancies, but it is good for every pregnant woman to do. Your health care provider may ask you to start counting fetal movements at 28 weeks of the pregnancy. Fetal  movements often increase: °· After eating a full meal. °· After physical activity. °· After eating or drinking something sweet or cold. °· At rest. °Pay attention to when you feel the baby is most active. This will help you notice a pattern of your baby's sleep and wake cycles and what factors contribute to an increase in fetal movement. It is important to perform a fetal movement count at the same time each day when your baby is normally most active.  °HOW TO COUNT FETAL MOVEMENTS °5. Find a quiet and comfortable area to sit or lie down on your left side. Lying on your left side provides the best blood and oxygen circulation to your baby. °6. Write down the day and time on a sheet of paper or in a journal. °7. Start counting kicks, flutters, swishes, rolls, or jabs in a 2-hour period. You should feel at least 10 movements within 2 hours. °8. If you do not feel 10 movements in 2 hours, wait 2-3 hours and count again. Look for a change in the pattern or not enough counts in 2 hours. °SEEK MEDICAL CARE IF: °· You feel less than 10 counts in 2 hours, tried twice. °· There is no movement in over an hour. °· The pattern is changing or taking longer each day to reach 10 counts in 2 hours. °· You feel the baby is not moving as he or she usually does. °Date: ____________ Movements: ____________ Start time: ____________ Finish time: ____________  °Date: ____________ Movements: ____________ Start time: ____________ Finish time: ____________ °Date: ____________ Movements: ____________ Start time: ____________ Finish time: ____________ °Date: ____________ Movements: ____________ Start time: ____________ Finish time: ____________ °Date: ____________ Movements: ____________ Start time: ____________ Finish time: ____________ °Date: ____________ Movements: ____________ Start time: ____________ Finish time: ____________ °Date: ____________ Movements: ____________ Start time: ____________ Finish time: ____________ °Date: ____________  Movements: ____________ Start time: ____________ Finish time: ____________  °Date: ____________ Movements: ____________ Start time: ____________ Finish time: ____________ °Date: ____________ Movements: ____________ Start time: ____________ Finish time: ____________ °Date: ____________ Movements: ____________ Start time: ____________ Finish time: ____________ °Date: ____________ Movements: ____________ Start time: ____________ Finish time: ____________ °Date: ____________ Movements: ____________ Start time: ____________ Finish time: ____________ °Date: ____________ Movements: ____________ Start time: ____________ Finish time: ____________ °Date: ____________ Movements: ____________ Start time: ____________ Finish time: ____________  °Date: ____________ Movements: ____________ Start time: ____________ Finish time: ____________ °Date: ____________ Movements: ____________ Start time: ____________ Finish time: ____________ °Date: ____________ Movements: ____________ Start time: ____________ Finish time: ____________ °Date: ____________ Movements: ____________ Start time: ____________ Finish time: ____________ °Date: ____________ Movements: ____________ Start time: ____________ Finish time: ____________ °Date: ____________ Movements: ____________ Start time: ____________ Finish time: ____________ °Date: ____________ Movements: ____________ Start time: ____________ Finish time: ____________  °Date: ____________ Movements: ____________ Start time: ____________ Finish time: ____________ °Date: ____________ Movements: ____________ Start time: ____________ Finish time: ____________ °Date: ____________ Movements: ____________ Start time: ____________ Finish time: ____________ °Date: ____________ Movements: ____________ Start time: ____________ Finish time: ____________ °Date: ____________ Movements: ____________ Start time: ____________ Finish time: ____________ °Date: ____________ Movements: ____________ Start time:  ____________ Finish time: ____________ °Date: ____________ Movements:   ____________ Start time: ____________ Finish time: ____________  °Date: ____________ Movements: ____________ Start time: ____________ Finish time: ____________ °Date: ____________ Movements: ____________ Start time: ____________ Finish time: ____________ °Date: ____________ Movements: ____________ Start time: ____________ Finish time: ____________ °Date: ____________ Movements: ____________ Start time: ____________ Finish time: ____________ °Date: ____________ Movements: ____________ Start time: ____________ Finish time: ____________ °Date: ____________ Movements: ____________ Start time: ____________ Finish time: ____________ °Date: ____________ Movements: ____________ Start time: ____________ Finish time: ____________  °Date: ____________ Movements: ____________ Start time: ____________ Finish time: ____________ °Date: ____________ Movements: ____________ Start time: ____________ Finish time: ____________ °Date: ____________ Movements: ____________ Start time: ____________ Finish time: ____________ °Date: ____________ Movements: ____________ Start time: ____________ Finish time: ____________ °Date: ____________ Movements: ____________ Start time: ____________ Finish time: ____________ °Date: ____________ Movements: ____________ Start time: ____________ Finish time: ____________ °Date: ____________ Movements: ____________ Start time: ____________ Finish time: ____________  °Date: ____________ Movements: ____________ Start time: ____________ Finish time: ____________ °Date: ____________ Movements: ____________ Start time: ____________ Finish time: ____________ °Date: ____________ Movements: ____________ Start time: ____________ Finish time: ____________ °Date: ____________ Movements: ____________ Start time: ____________ Finish time: ____________ °Date: ____________ Movements: ____________ Start time: ____________ Finish time: ____________ °Date:  ____________ Movements: ____________ Start time: ____________ Finish time: ____________ °Date: ____________ Movements: ____________ Start time: ____________ Finish time: ____________  °Date: ____________ Movements: ____________ Start time: ____________ Finish time: ____________ °Date: ____________ Movements: ____________ Start time: ____________ Finish time: ____________ °Date: ____________ Movements: ____________ Start time: ____________ Finish time: ____________ °Date: ____________ Movements: ____________ Start time: ____________ Finish time: ____________ °Date: ____________ Movements: ____________ Start time: ____________ Finish time: ____________ °Date: ____________ Movements: ____________ Start time: ____________ Finish time: ____________ °Document Released: 10/22/2006 Document Revised: 02/06/2014 Document Reviewed: 07/19/2012 °[employer]® Patient Information ©2015 [employer]. This information is not intended to replace advice given to you by your health care provider. Make sure you discuss any questions you have with your health care provider. ° °

## 2015-03-16 ENCOUNTER — Ambulatory Visit (INDEPENDENT_AMBULATORY_CARE_PROVIDER_SITE_OTHER): Payer: 59 | Admitting: Obstetrics & Gynecology

## 2015-03-16 ENCOUNTER — Encounter: Payer: Self-pay | Admitting: *Deleted

## 2015-03-16 ENCOUNTER — Telehealth (HOSPITAL_COMMUNITY): Payer: Self-pay | Admitting: *Deleted

## 2015-03-16 VITALS — BP 124/83 | HR 89 | Temp 98.2°F | Wt 147.4 lb

## 2015-03-16 DIAGNOSIS — O480 Post-term pregnancy: Secondary | ICD-10-CM | POA: Diagnosis not present

## 2015-03-16 LAB — POCT URINALYSIS DIP (DEVICE)
BILIRUBIN URINE: NEGATIVE
Glucose, UA: NEGATIVE mg/dL
HGB URINE DIPSTICK: NEGATIVE
Ketones, ur: NEGATIVE mg/dL
Nitrite: NEGATIVE
Protein, ur: 30 mg/dL — AB
SPECIFIC GRAVITY, URINE: 1.025 (ref 1.005–1.030)
Urobilinogen, UA: 1 mg/dL (ref 0.0–1.0)
pH: 7 (ref 5.0–8.0)

## 2015-03-16 NOTE — Progress Notes (Signed)
Pain- ligament  Pressure- bladder   Edema- feet

## 2015-03-16 NOTE — Telephone Encounter (Signed)
Preadmission screen  

## 2015-03-16 NOTE — Progress Notes (Signed)
Subjective:  Suzanne Goodwin is a 19 y.o. G2P0010 at [redacted]w[redacted]d being seen today for ongoing prenatal care.  Patient reports no complaints.  Contractions: Irregular.  Vag. Bleeding: None. Movement: Present. Denies leaking of fluid.  Was ruled out for labor last night in MAU.  The following portions of the patient's history were reviewed and updated as appropriate: allergies, current medications, past family history, past medical history, past social history, past surgical history and problem list.   Objective:   Filed Vitals:   03/16/15 1106  BP: 124/83  Pulse: 89  Temp: 98.2 F (36.8 C)  Weight: 147 lb 6.4 oz (66.86 kg)    Fetal Status: Fetal Heart Rate (bpm): 135 Fundal Height: 40 cm Movement: Present  Presentation: Vertex  General:  Alert, oriented and cooperative. Patient is in no acute distress.  Skin: Skin is warm and dry. No rash noted.   Cardiovascular: Normal heart rate noted  Respiratory: Effort and breath sounds normal, no problems with respiration noted  Abdomen: Soft, gravid, appropriate for gestational age. Pain/Pressure: Present     Vaginal: Vag. Bleeding: None.    Vag D/C Character: White  Cervix: Dilation: 1.5 Effacement (%): 60 Station: -3  Extremities: Normal range of motion.  Edema: Trace  Mental Status: Normal mood and affect. Normal behavior. Normal judgment and thought content.   Urinalysis: Urine Protein: 1+ Urine Glucose: Negative  Assessment and Plan:  Pregnancy: G2P0010 at [redacted]w[redacted]d Postterm pregnancy, will get NST and AFI today NST performed today was reviewed and was found to be reactive.  AFI normal at 11.5 cm.  Continue recommended antenatal testing and prenatal care.  IOL scheduled 03/20/2015 at 7:30am, patient and family informed Labor symptoms and general obstetric precautions including but not limited to vaginal bleeding, contractions, leaking of fluid and fetal movement were reviewed in detail with the patient. Please refer to After Visit Summary for other  counseling recommendations.   Return in about 6 weeks (around 04/27/2015) for Postpartum visit.   Tereso Newcomer, MD

## 2015-03-16 NOTE — Patient Instructions (Signed)
Return to clinic for any obstetric concerns or go to MAU for evaluation Labor Induction  Labor induction is when steps are taken to cause a pregnant woman to begin the labor process. Most women go into labor on their own between 37 weeks and 42 weeks of the pregnancy. When this does not happen or when there is a medical need, methods may be used to induce labor. Labor induction causes a pregnant woman's uterus to contract. It also causes the cervix to soften (ripen), open (dilate), and thin out (efface). Usually, labor is not induced before 39 weeks of the pregnancy unless there is a problem with the baby or mother.  Before inducing labor, your health care provider will consider a number of factors, including the following:  The medical condition of you and the baby.   How many weeks along you are.   The status of the baby's lung maturity.   The condition of the cervix.   The position of the baby.  WHAT ARE THE REASONS FOR LABOR INDUCTION? Labor may be induced for the following reasons:  The health of the baby or mother is at risk.   The pregnancy is overdue by 1 week or more.   The water breaks but labor does not start on its own.   The mother has a health condition or serious illness, such as high blood pressure, infection, placental abruption, or diabetes.  The amniotic fluid amounts are low around the baby.   The baby is distressed.  Convenience or wanting the baby to be born on a certain date is not a reason for inducing labor. WHAT METHODS ARE USED FOR LABOR INDUCTION? Several methods of labor induction may be used, such as:   Prostaglandin medicine. This medicine causes the cervix to dilate and ripen. The medicine will also start contractions. It can be taken by mouth or by inserting a suppository into the vagina.   Inserting a thin tube (catheter) with a balloon on the end into the vagina to dilate the cervix. Once inserted, the balloon is expanded with water,  which causes the cervix to open.   Stripping the membranes. Your health care provider separates amniotic sac tissue from the cervix, causing the cervix to be stretched and causing the release of a hormone called progesterone. This may cause the uterus to contract. It is often done during an office visit. You will be sent home to wait for the contractions to begin. You will then come in for an induction.   Breaking the water. Your health care provider makes a hole in the amniotic sac using a small instrument. Once the amniotic sac breaks, contractions should begin. This may still take hours to see an effect.   Medicine to trigger or strengthen contractions. This medicine is given through an IV access tube inserted into a vein in your arm.  All of the methods of induction, besides stripping the membranes, will be done in the hospital. Induction is done in the hospital so that you and the baby can be carefully monitored.  HOW LONG DOES IT TAKE FOR LABOR TO BE INDUCED? Some inductions can take up to 2-3 days. Depending on the cervix, it usually takes less time. It takes longer when you are induced early in the pregnancy or if this is your first pregnancy. If a mother is still pregnant and the induction has been going on for 2-3 days, either the mother will be sent home or a cesarean delivery will be needed. WHAT  ARE THE RISKS ASSOCIATED WITH LABOR INDUCTION? Some of the risks of induction include:   Changes in fetal heart rate, such as too high, too low, or erratic.   Fetal distress.   Chance of infection for the mother and baby.   Increased chance of having a cesarean delivery.   Breaking off (abruption) of the placenta from the uterus (rare).   Uterine rupture (very rare).  When induction is needed for medical reasons, the benefits of induction may outweigh the risks. WHAT ARE SOME REASONS FOR NOT INDUCING LABOR? Labor induction should not be done if:   It is shown that your baby  does not tolerate labor.   You have had previous surgeries on your uterus, such as a myomectomy or the removal of fibroids.   Your placenta lies very low in the uterus and blocks the opening of the cervix (placenta previa).   Your baby is not in a head-down position.   The umbilical cord drops down into the birth canal in front of the baby. This could cut off the baby's blood and oxygen supply.   You have had a previous cesarean delivery.   There are unusual circumstances, such as the baby being extremely premature.  Document Released: 02/11/2007 Document Revised: 05/25/2013 Document Reviewed: 04/21/2013 [facility] Patient Information 2015 [place], [place]. This information is not intended to replace advice given to you by your health care provider. Make sure you discuss any questions you have with your health care provider.

## 2015-03-17 ENCOUNTER — Inpatient Hospital Stay (HOSPITAL_COMMUNITY): Payer: 59 | Admitting: Anesthesiology

## 2015-03-17 ENCOUNTER — Encounter (HOSPITAL_COMMUNITY): Payer: Self-pay | Admitting: *Deleted

## 2015-03-17 ENCOUNTER — Inpatient Hospital Stay (HOSPITAL_COMMUNITY)
Admission: AD | Admit: 2015-03-17 | Discharge: 2015-03-19 | DRG: 775 | Disposition: A | Discharge status: Home / Self Care | Payer: 59 | Source: Ambulatory Visit | Attending: Obstetrics and Gynecology | Admitting: Obstetrics and Gynecology

## 2015-03-17 DIAGNOSIS — Z8249 Family history of ischemic heart disease and other diseases of the circulatory system: Secondary | ICD-10-CM | POA: Diagnosis not present

## 2015-03-17 DIAGNOSIS — F419 Anxiety disorder, unspecified: Secondary | ICD-10-CM | POA: Diagnosis present

## 2015-03-17 DIAGNOSIS — O99344 Other mental disorders complicating childbirth: Secondary | ICD-10-CM | POA: Diagnosis present

## 2015-03-17 DIAGNOSIS — Z833 Family history of diabetes mellitus: Secondary | ICD-10-CM | POA: Diagnosis not present

## 2015-03-17 DIAGNOSIS — Z3A40 40 weeks gestation of pregnancy: Secondary | ICD-10-CM | POA: Diagnosis present

## 2015-03-17 DIAGNOSIS — O480 Post-term pregnancy: Secondary | ICD-10-CM

## 2015-03-17 DIAGNOSIS — Z87891 Personal history of nicotine dependence: Secondary | ICD-10-CM | POA: Diagnosis not present

## 2015-03-17 LAB — COMPREHENSIVE METABOLIC PANEL
ALBUMIN: 3 g/dL — AB (ref 3.5–5.0)
ALT: 11 U/L — AB (ref 14–54)
ANION GAP: 6 (ref 5–15)
AST: 25 U/L (ref 15–41)
Alkaline Phosphatase: 210 U/L — ABNORMAL HIGH (ref 38–126)
BUN: 14 mg/dL (ref 6–20)
CO2: 22 mmol/L (ref 22–32)
Calcium: 8.8 mg/dL — ABNORMAL LOW (ref 8.9–10.3)
Chloride: 109 mmol/L (ref 101–111)
Creatinine, Ser: 0.96 mg/dL (ref 0.44–1.00)
GFR calc Af Amer: >60 mL/min (ref >60)
Glucose, Bld: 79 mg/dL (ref 65–99)
Potassium: 4.1 mmol/L (ref 3.5–5.1)
Sodium: 137 mmol/L (ref 135–145)
Total Bilirubin: 0.5 mg/dL (ref 0.3–1.2)
Total Protein: 5.9 g/dL — ABNORMAL LOW (ref 6.5–8.1)

## 2015-03-17 LAB — TYPE AND SCREEN
ABO/RH(D): B POS
Antibody Screen: NEGATIVE

## 2015-03-17 LAB — CBC
HCT: 30.4 % — ABNORMAL LOW (ref 36.0–46.0)
Hemoglobin: 10.6 g/dL — ABNORMAL LOW (ref 12.0–15.0)
MCH: 33 pg (ref 26.0–34.0)
MCHC: 34.9 g/dL (ref 30.0–36.0)
MCV: 94.7 fL (ref 78.0–100.0)
Platelets: 218 10*3/uL (ref 150–400)
RBC: 3.21 MIL/uL — ABNORMAL LOW (ref 3.87–5.11)
RDW: 12.9 % (ref 11.5–15.5)
WBC: 9.5 10*3/uL (ref 4.0–10.5)

## 2015-03-17 LAB — PROTEIN / CREATININE RATIO, URINE
CREATININE, URINE: 320 mg/dL
Protein Creatinine Ratio: 0.12 mg/mg{Cre} (ref 0.00–0.15)
TOTAL PROTEIN, URINE: 39 mg/dL

## 2015-03-17 LAB — AMNISURE RUPTURE OF MEMBRANE (ROM) NOT AT ARMC: Amnisure ROM: NEGATIVE

## 2015-03-17 LAB — ABO/RH: ABO/RH(D): B POS

## 2015-03-17 MED ORDER — TERBUTALINE SULFATE 1 MG/ML IJ SOLN: 0.2500 mg | Freq: Once | INTRAMUSCULAR | Status: AC | PRN

## 2015-03-17 MED ORDER — CITRIC ACID-SODIUM CITRATE 334-500 MG/5ML PO SOLN: 30.0000 mL | ORAL | Status: DC | PRN

## 2015-03-17 MED ORDER — FENTANYL 2.5 MCG/ML BUPIVACAINE 1/10 % EPIDURAL INFUSION (WH - ANES)
14.0000 mL/h | INTRAMUSCULAR | Status: DC | PRN
  Administered 2015-03-17 (×3): 14 mL/h via EPIDURAL
  Filled 2015-03-17 (×2): qty 125

## 2015-03-17 MED ORDER — LACTATED RINGERS IV SOLN
500.0000 mL | INTRAVENOUS | Status: DC | PRN
  Administered 2015-03-17: 500 mL via INTRAVENOUS

## 2015-03-17 MED ORDER — LIDOCAINE HCL (PF) 1 % IJ SOLN
30.0000 mL | INTRAMUSCULAR | Status: DC | PRN
  Filled 2015-03-17: qty 30

## 2015-03-17 MED ORDER — EPHEDRINE 5 MG/ML INJ
10.0000 mg | INTRAVENOUS | Status: DC | PRN
  Filled 2015-03-17: qty 2

## 2015-03-17 MED ORDER — OXYCODONE-ACETAMINOPHEN 5-325 MG PO TABS: 1.0000 | ORAL_TABLET | ORAL | Status: DC | PRN

## 2015-03-17 MED ORDER — LACTATED RINGERS IV SOLN
INTRAVENOUS | Status: DC
  Administered 2015-03-17: 125 mL/h via INTRAVENOUS
  Administered 2015-03-17: 09:00:00 via INTRAVENOUS

## 2015-03-17 MED ORDER — DIPHENHYDRAMINE HCL 50 MG/ML IJ SOLN: 12.5000 mg | INTRAMUSCULAR | Status: DC | PRN

## 2015-03-17 MED ORDER — ACETAMINOPHEN 325 MG PO TABS
650.0000 mg | ORAL_TABLET | ORAL | Status: DC | PRN
Start: 2015-03-17 — End: 2015-03-18

## 2015-03-17 MED ORDER — FLEET ENEMA 7-19 GM/118ML RE ENEM: 1.0000 | ENEMA | Freq: Every day | RECTAL | Status: DC | PRN

## 2015-03-17 MED ORDER — ONDANSETRON HCL 4 MG/2ML IJ SOLN
4.0000 mg | Freq: Four times a day (QID) | INTRAMUSCULAR | Status: DC | PRN
  Administered 2015-03-17: 4 mg via INTRAVENOUS
  Filled 2015-03-17: qty 2

## 2015-03-17 MED ORDER — PHENYLEPHRINE 40 MCG/ML (10ML) SYRINGE FOR IV PUSH (FOR BLOOD PRESSURE SUPPORT)
80.0000 ug | PREFILLED_SYRINGE | INTRAVENOUS | Status: DC | PRN
  Filled 2015-03-17: qty 2
  Filled 2015-03-17: qty 20

## 2015-03-17 MED ORDER — OXYTOCIN BOLUS FROM INFUSION
500.0000 mL | INTRAVENOUS | Status: DC
  Administered 2015-03-18: 500 mL via INTRAVENOUS

## 2015-03-17 MED ORDER — OXYCODONE-ACETAMINOPHEN 5-325 MG PO TABS: 2.0000 | ORAL_TABLET | ORAL | Status: DC | PRN

## 2015-03-17 MED ORDER — FENTANYL 2.5 MCG/ML BUPIVACAINE 1/10 % EPIDURAL INFUSION (WH - ANES): 14.0000 mL/h | INTRAMUSCULAR | Status: DC | PRN

## 2015-03-17 MED ORDER — OXYCODONE-ACETAMINOPHEN 5-325 MG PO TABS
2.0000 | ORAL_TABLET | Freq: Once | ORAL | Status: AC
  Administered 2015-03-17: 2 via ORAL
  Filled 2015-03-17: qty 2

## 2015-03-17 MED ORDER — FENTANYL CITRATE (PF) 100 MCG/2ML IJ SOLN
100.0000 ug | INTRAMUSCULAR | Status: DC | PRN
  Administered 2015-03-17 (×2): 100 ug via INTRAVENOUS
  Filled 2015-03-17 (×2): qty 2

## 2015-03-17 MED ORDER — OXYTOCIN 40 UNITS IN LACTATED RINGERS INFUSION - SIMPLE MED
62.5000 mL/h | INTRAVENOUS | Status: DC
  Administered 2015-03-18: 62.5 mL/h via INTRAVENOUS

## 2015-03-17 MED ORDER — ONDANSETRON 8 MG PO TBDP
8.0000 mg | ORAL_TABLET | Freq: Once | ORAL | Status: AC
Start: 2015-03-17 — End: 2015-03-17
  Administered 2015-03-17: 8 mg via ORAL
  Filled 2015-03-17: qty 1

## 2015-03-17 MED ORDER — PROMETHAZINE HCL 25 MG/ML IJ SOLN: 25.0000 mg | Freq: Once | INTRAMUSCULAR | Status: DC

## 2015-03-17 MED ORDER — LIDOCAINE HCL (PF) 1 % IJ SOLN
INTRAMUSCULAR | Status: DC | PRN
  Administered 2015-03-17: 6 mL
  Administered 2015-03-17: 4 mL

## 2015-03-17 MED ORDER — OXYTOCIN 40 UNITS IN LACTATED RINGERS INFUSION - SIMPLE MED
1.0000 m[IU]/min | INTRAVENOUS | Status: DC
  Administered 2015-03-17: 2 m[IU]/min via INTRAVENOUS
  Filled 2015-03-17: qty 1000

## 2015-03-17 NOTE — MAU Note (Signed)
Pt to go to room 172 per HMitchell, RN charge.

## 2015-03-17 NOTE — Anesthesia Preprocedure Evaluation (Signed)

## 2015-03-17 NOTE — Anesthesia Procedure Notes (Signed)

## 2015-03-17 NOTE — H&P (Signed)
LABOR ADMISSION HISTORY AND PHYSICAL  Suzanne Goodwin is a 19 y.o. female G2P0010 with IUP at [redacted]w[redacted]d by 1st trimester [place] presenting for contractions starting 4am today increasing in intensity and frequency. She reports +FMs, No LOF, no VB, no blurry vision, headaches or peripheral edema, and RUQ pain. Endorses heart burn no chest pain otherwise, denies dyspnea, leg pain. She plans on breast feeding. She request OCPs for birth control, may consider Depo.  Dating: By 1st trimester [place] --->  Estimated Date of Delivery: 03/13/15  [facility]renatal [facility] at [facility]. Uncomplicated pregnancy, denies h/o hypertension or hypertension during pregnancy. Was hypertensive 134/92 on admission, currently 128/93.  Possible request epidural later, desires fentanyl for now.   Prenatal History/Complications:  Past Medical History: Past Medical History  Diagnosis Date  . Anxiety     Past Surgical History: Past Surgical History  Procedure Laterality Date  . Wisdom tooth extraction      Obstetrical History: OB History    Gravida Para Term Preterm AB TAB SAB Ectopic Multiple Living        Social History: History   Social History  . Marital Status: Single    Spouse Name: N/A  . Number of Children: N/A  . Years of Education: N/A   Social History Main Topics  . Smoking status: Former Smoker -- 0.10 packs/day for 1 years    Quit date: 07/26/2014  . Smokeless tobacco: Never Used  . Alcohol Use: No  . Drug Use: No     Comment: LAST SMOKED   9-25  . Sexual Activity: Yes    Birth Control/ Protection: None   Other Topics Concern  . None   Social History Narrative    Family History: Family History  Problem Relation Age of Onset  . Hypertension Mother   . Hypertension Father   . Diabetes Paternal Grandmother     Allergies: Allergies  Allergen Reactions  . Grapeseed Extract [Nutritional Supplements]     muscadine    Prescriptions prior to admission  Medication Sig Dispense  Refill Last Dose  . cetirizine (ZYRTEC) 10 MG tablet Take 10 mg by mouth daily.   Past Week at Unknown time  . flintstones complete (FLINTSTONES) 60 MG chewable tablet Chew 2 tablets by mouth daily.    Past Week at Unknown time   Review of Systems   All systems reviewed and negative except as stated in HPI  Blood pressure 121/72, pulse 61, temperature 98.1 F (36.7 C), temperature source Oral, resp. rate 16, height  (1.6 m), weight 66.679 kg (147 lb), last menstrual period 05/30/2014, SpO2 100 %. General appearance: alert, cooperative and no distress Lungs: clear to auscultation bilaterally Heart: regular rate and rhythm Abdomen: soft, non-tender; bowel sounds normal Pelvic: adequate Extremities: Homans sign is negative, no sign of DVT DTR's 2+ Presentation: cephalic Fetal monitoring Baseline: 125 bpm, Variability: Good {> 6 bpm), Accelerations: Reactive and Decelerations: Absent Uterine activity Frequency: Every 5 minutes Dilation: 3.5 Effacement (%): 90 Station: -2 Exam by:: ginger morris rn   Prenatal labs: ABO, Rh: --/--/B POS (06/11 1610) Antibody: NEG (06/11 0629) Rubella:   1.97 (ref <0.9 index) (11/08/2014) RPR: NON REAC (03/03 1510)  HBsAg: NEGATIVE (02/03 1422)  HIV: NONREACTIVE (03/03 1510)  GBS: Negative (05/20 0000)  1 hr Glucola 79 Genetic screening  Normal Anatomy US Normal   Prenatal Transfer Tool  Maternal Diabetes: No Genetic Screening: Normal Maternal Ultrasounds/Referrals: Normal Fetal Ultrasounds or other Referrals:  None Maternal Substance Abuse:  No Significant Maternal Medications:  None Significant Maternal Lab Results: None  Results for orders placed or performed during the hospital encounter of 03/17/15 (from the past 24 hour(s))  Amnisure rupture of membrane (rom)not at [facility]   Collection Time: 03/17/15  5:00 AM  Result Value Ref Range   Amnisure ROM NEGATIVE   CBC   Collection Time: 03/17/15  6:29 AM  Result Value Ref Range   WBC  9.5 4.0 - 10.5 K/uL   RBC 3.21 (L) 3.87 - 5.11 MIL/uL   Hemoglobin 10.6 (L) 12.0 - 15.0 g/dL   HCT 16.1 (L) 09.6 - 04.5 %   MCV 94.7 78.0 - 100.0 fL   MCH 33.0 26.0 - 34.0 pg   MCHC 34.9 30.0 - 36.0 g/dL   RDW 40.9 81.1 - 91.4 %   Platelets 218 150 - 400 K/uL  Comprehensive metabolic panel   Collection Time: 03/17/15  6:29 AM  Result Value Ref Range   Sodium 137 135 - 145 mmol/L   Potassium 4.1 3.5 - 5.1 mmol/L   Chloride 109 101 - 111 mmol/L   CO2 22 22 - 32 mmol/L   Glucose, Bld 79 65 - 99 mg/dL   BUN 14 6 - 20 mg/dL   Creatinine, Ser 7.82 0.44 - 1.00 mg/dL   Calcium 8.8 (L) 8.9 - 10.3 mg/dL   Total Protein 5.9 (L) 6.5 - 8.1 g/dL   Albumin 3.0 (L) 3.5 - 5.0 g/dL   AST 25 15 - 41 U/L   ALT 11 (L) 14 - 54 U/L   Alkaline Phosphatase 210 (H) 38 - 126 U/L   Total Bilirubin 0.5 0.3 - 1.2 mg/dL   GFR calc non Af Amer >60 >60 mL/min   GFR calc Af Amer >60 >60 mL/min   Anion gap 6 5 - 15  Type and screen   Collection Time: 03/17/15  6:29 AM  Result Value Ref Range   ABO/RH(D) B POS    Antibody Screen NEG    Sample Expiration 03/20/2015   Protein / creatinine ratio, urine   Collection Time: 03/17/15  6:30 AM  Result Value Ref Range   Creatinine, Urine 320.00 mg/dL   Total Protein, Urine 39 mg/dL   Protein Creatinine Ratio 0.12 0.00 - 0.15 mg/mg[Cre]    Patient Active Problem List   Diagnosis Date Noted  . Labor and delivery indication for care or intervention 03/17/2015  . Anxiety 11/08/2014  . Post term pregnancy, antepartum 10/28/2014  . Abdominal pain in pregnancy, antepartum     Assessment: Suzanne Goodwin is a 19 y.o. G2P0010 at [redacted]w[redacted]d here for labor. Admit to birthing suite. Monitor vital signs especially blood pressures.   #Labor: contractions q5 min #Pain: Prn fentanyl 100 mcg q1 hr prn, pt may request epidural later as needed #FWB: Cat I #ID:  GBS neg #MOF: breast #MOC:OCPs, may consider Depo #Circ:  Outpatient  Pediatrician: [facility] Dr. Carmon Ginsberg    Suzanne Goodwin 03/17/2015, 11:27 AM  OB fellow attestation:  I have seen and examined this patient; I agree with above documentation in the medical student's note.   Suzanne Goodwin is a 19 y.o. G2P0010 here having made slow change this morning in MAU  PE: BP 121/81 mmHg  Pulse 69  Temp(Src) 98.1 F (36.7 C) (Oral)  Resp 16  Ht  (1.6 m)  Wt 147 lb (66.679 kg)  BMI 26.05 kg/m2  SpO2 97%  LMP 05/30/2014 (Approximate) Gen: calm comfortable, NAD Resp:  normal effort, no distress Abd: gravid  ROS, labs, PMH reviewed  Plan: MOF: breast MOC: OCPs vs depo ID: GBS neg FWB: cat I Labor: expectant mgmt Circ: OP Elevated BP: no hx of preE or gHTN, had some elevated diastolics in MAU, preE labs normal  Suzanne Goodwin ROCIO 03/17/2015, 8:45 PM

## 2015-03-17 NOTE — Progress Notes (Signed)
Lori Clemmons CNM notified of pt's VE,, contraction pattern, FHR pattern and blood pressures, orders received to admit pt

## 2015-03-17 NOTE — Progress Notes (Signed)
Dr Loreta Ave notified of pt's VE, contraction pattern and lab results, orders received to recheck cervix in an hour

## 2015-03-17 NOTE — MAU Note (Signed)
Patient had watery discharge approx 30 min ago and also having contractions. Baby moving well and no bleeding

## 2015-03-17 NOTE — Progress Notes (Signed)
Labor Progress Note  S: feeling some pressure with contractions  O:  BP 107/67 mmHg  Pulse 80  Temp(Src) 98.1 F (36.7 C) (Oral)  Resp 16  Ht 5\' 3"  (1.6 m)  Wt 147 lb (66.679 kg)  BMI 26.05 kg/m2  SpO2 97%  LMP 05/30/2014 (Approximate) Cat I CVE: 8/90/+1  A&P: 19 y.o. G2P0010 [redacted]w[redacted]d admitted in labor # labor: progressing normally, AROM this exam, clear vs very very light meconium  Perry Mount, MD 9:58 PM

## 2015-03-18 ENCOUNTER — Encounter (HOSPITAL_COMMUNITY): Payer: Self-pay | Admitting: *Deleted

## 2015-03-18 DIAGNOSIS — Z3A40 40 weeks gestation of pregnancy: Secondary | ICD-10-CM

## 2015-03-18 LAB — RPR: RPR Ser Ql: NONREACTIVE

## 2015-03-18 MED ORDER — SODIUM CHLORIDE 0.9 % IJ SOLN: 3.0000 mL | INTRAMUSCULAR | Status: DC | PRN

## 2015-03-18 MED ORDER — OXYTOCIN 40 UNITS IN LACTATED RINGERS INFUSION - SIMPLE MED: 62.5000 mL/h | INTRAVENOUS | Status: DC | PRN

## 2015-03-18 MED ORDER — SENNOSIDES-DOCUSATE SODIUM 8.6-50 MG PO TABS
2.0000 | ORAL_TABLET | ORAL | Status: DC
  Administered 2015-03-18: 2 via ORAL
  Filled 2015-03-18: qty 2

## 2015-03-18 MED ORDER — DIPHENHYDRAMINE HCL 25 MG PO CAPS: 25.0000 mg | ORAL_CAPSULE | Freq: Four times a day (QID) | ORAL | Status: DC | PRN

## 2015-03-18 MED ORDER — OXYCODONE-ACETAMINOPHEN 5-325 MG PO TABS: 1.0000 | ORAL_TABLET | ORAL | Status: DC | PRN

## 2015-03-18 MED ORDER — ONDANSETRON HCL 4 MG/2ML IJ SOLN: 4.0000 mg | INTRAMUSCULAR | Status: DC | PRN

## 2015-03-18 MED ORDER — BISACODYL 10 MG RE SUPP
10.0000 mg | Freq: Every day | RECTAL | Status: DC | PRN
Start: 2015-03-18 — End: 2015-03-19

## 2015-03-18 MED ORDER — SODIUM CHLORIDE 0.9 % IV SOLN: 250.0000 mL | INTRAVENOUS | Status: DC | PRN

## 2015-03-18 MED ORDER — ACETAMINOPHEN 325 MG PO TABS
650.0000 mg | ORAL_TABLET | ORAL | Status: DC | PRN
Start: 2015-03-18 — End: 2015-03-19
  Administered 2015-03-18: 650 mg via ORAL
  Filled 2015-03-18: qty 2

## 2015-03-18 MED ORDER — PRENATAL MULTIVITAMIN CH
1.0000 | ORAL_TABLET | Freq: Every day | ORAL | Status: DC
  Administered 2015-03-18: 1 via ORAL
  Filled 2015-03-18: qty 1

## 2015-03-18 MED ORDER — FLEET ENEMA 7-19 GM/118ML RE ENEM: 1.0000 | ENEMA | Freq: Every day | RECTAL | Status: DC | PRN

## 2015-03-18 MED ORDER — SIMETHICONE 80 MG PO CHEW: 80.0000 mg | CHEWABLE_TABLET | ORAL | Status: DC | PRN

## 2015-03-18 MED ORDER — ONDANSETRON HCL 4 MG PO TABS: 4.0000 mg | ORAL_TABLET | ORAL | Status: DC | PRN

## 2015-03-18 MED ORDER — LANOLIN HYDROUS EX OINT: TOPICAL_OINTMENT | CUTANEOUS | Status: DC | PRN

## 2015-03-18 MED ORDER — SODIUM CHLORIDE 0.9 % IJ SOLN: 3.0000 mL | Freq: Two times a day (BID) | INTRAMUSCULAR | Status: DC

## 2015-03-18 MED ORDER — IBUPROFEN 600 MG PO TABS
600.0000 mg | ORAL_TABLET | Freq: Four times a day (QID) | ORAL | Status: DC
  Administered 2015-03-18 (×4): 600 mg via ORAL
  Filled 2015-03-18 (×5): qty 1

## 2015-03-18 MED ORDER — DIBUCAINE 1 % RE OINT: 1.0000 "application " | TOPICAL_OINTMENT | RECTAL | Status: DC | PRN

## 2015-03-18 MED ORDER — BENZOCAINE-MENTHOL 20-0.5 % EX AERO
1.0000 "application " | INHALATION_SPRAY | CUTANEOUS | Status: DC | PRN
  Administered 2015-03-18: 1 via TOPICAL
  Filled 2015-03-18: qty 56

## 2015-03-18 MED ORDER — OXYCODONE-ACETAMINOPHEN 5-325 MG PO TABS
1.0000 | ORAL_TABLET | ORAL | Status: DC | PRN
  Administered 2015-03-18 (×2): 1 via ORAL
  Filled 2015-03-18 (×2): qty 1

## 2015-03-18 MED ORDER — ZOLPIDEM TARTRATE 5 MG PO TABS: 5.0000 mg | ORAL_TABLET | Freq: Every evening | ORAL | Status: DC | PRN

## 2015-03-18 MED ORDER — WITCH HAZEL-GLYCERIN EX PADS: 1.0000 "application " | MEDICATED_PAD | CUTANEOUS | Status: DC | PRN

## 2015-03-18 NOTE — Anesthesia Postprocedure Evaluation (Signed)
Anesthesia Post Note  Patient: Suzanne Goodwin  Procedure(s) Performed: * No procedures listed *  Anesthesia type: Epidural  Patient location: Mother/Baby  Post pain: Pain level controlled  Post assessment: Post-op Vital signs reviewed  Last Vitals:  Filed Vitals:   03/18/15 0900  BP: 107/70  Pulse: 56  Temp: 36.8 C  Resp: 16    Post vital signs: Reviewed  Level of consciousness:alert  Complications: No apparent anesthesia complications

## 2015-03-18 NOTE — Progress Notes (Signed)
CLINICAL SOCIAL WORK MATERNAL/CHILD NOTE  Patient Details  Name: Suzanne Goodwin MRN: [id number] Date of Birth: 03/18/2016  Date: 03/18/2015  Clinical Social Worker Initiating Note: Johnnye Lana, LCSWDate/ Time Initiated: 03/18/15/0930   Child's Name: Suzanne Goodwin   Legal Guardian:  (Parents Oklee and Cleophus Molt)   Need for Interpreter: None   Date of Referral: 03/17/15   Reason for Referral: Other (Comment)   [facility] Source: [facility]   Address: [address] [place], [place] [address]  Phone number:  [phone])   Household Members: Significant Other   Natural Supports (not living in the home): Extended Family   Professional Supports:    Employment: (FOB is employed)   Type of Work:     Education:     Social worker, [employer]   Other Resources:     Cultural/Religious Considerations Which May Impact Care: none noted  Strengths: Ability to meet basic needs , Home prepared for child , Compliance with medical plan    Risk Factors/Current Problems: None   Cognitive State: Alert , Able to Concentrate    Mood/Affect: Bright , Happy    CSW Assessment: Acknowledged order for Social Work consult to assess mother's history of anxiety. Parents are not married, but reside together. FOB was present, and supportive of mother and newborn. Mother acknowledged hx of anxiety and states that she was prescribed Xanax, but stop taking medication because of the pregnancy. Mother state that she is worried about being a first time mother, but feel that she has adequate support. Discussed some challenges that many couple face and the importance of self care and support of family and friends during the next 6 weeks. She denies any hx of substance abuse. No acute social concerns related at this time. Mother informed of social work Surveyor, mining.   CSW  Plan/Description:    Spoke with her about the signs/symptoms of PP Depression, and provided information and literature on PP Depression No current barriers to discharge   Ryelee Albee J, LCSW 03/18/2015, 4:19 PM

## 2015-03-19 MED ORDER — OXYCODONE-ACETAMINOPHEN 5-325 MG PO TABS: 1.0000 | ORAL_TABLET | ORAL | Status: DC | PRN

## 2015-03-19 MED ORDER — IBUPROFEN 600 MG PO TABS: 600.0000 mg | ORAL_TABLET | Freq: Four times a day (QID) | ORAL | Status: DC

## 2015-03-19 MED ORDER — NORETHINDRONE 0.35 MG PO TABS: 1.0000 | ORAL_TABLET | Freq: Every day | ORAL | Status: DC

## 2015-03-19 NOTE — Lactation Note (Signed)
This note was copied from the chart of Suzanne Goodwin. Lactation Consultation Note  Patient Name: Suzanne Brie Gowell EFEOF'H Date: 03/19/2015 Reason for consult: Follow-up assessment  Baby is 34 hours old, 3 % weight loss, 4 wets, 5 stools ( and 1 smear)  & Bf's 10 -20 mins. Bili at 24 hours = 8.  After diaper change, LC observed mom attempting to latch, cross cradle in his clothes  LC suggested mom place baby skin to skin, and mom continued to attempt to latch . LC offered  To obtain pillows for support and mom declined. Mom hand expressed , colostrum noted and  Latched the baby for a few seconds and baby released and fell back to sleep.  LC reviewed basics of latching and the importance of depth and hearing swallows with every latch. LC reviewed sore nipple and engorgement prevention and tx. Referring to the Baby and me booklet. Per mom had already been given a hand pump and felt comfortable using it. Mother informed of post-discharge support and given phone number to the lactation department, including services for phone call assistance; out-patient appointments; and breastfeeding support group. List of other breastfeeding resources in the community given in the handout. Encouraged mother to call for problems or concerns related to breastfeeding.   Maternal Data Has patient been taught Hand Expression?: Yes  Feeding Feeding Type: Breast Fed Length of feed: 20 min (per mom )  LATCH Score/Interventions Latch: Repeated attempts needed to sustain latch, nipple held in mouth throughout feeding, stimulation needed to elicit sucking reflex. Intervention(s): Skin to skin;Teach feeding cues;Waking techniques Intervention(s): Adjust position;Assist with latch;Breast massage;Breast compression  Audible Swallowing: None  Type of Nipple: Flat (areola compressible )  Comfort (Breast/Nipple): Soft / non-tender     Hold (Positioning): Assistance needed to correctly position infant at  breast and maintain latch. Intervention(s): Breastfeeding basics reviewed;Support Pillows;Position options;Skin to skin  LATCH Score: 5  Lactation Tools Discussed/Used Tools: Pump (per mom already had hand pump , and aware of how to use it . ) Breast pump type: Manual WIC Program: Yes   Consult Status Consult Status: Complete Date: 03/19/15    Kathrin Greathouse 03/19/2015, 12:12 PM

## 2015-03-19 NOTE — Discharge Summary (Signed)
Obstetric Discharge Summary Reason for Admission: onset of labor Prenatal Procedures: none Intrapartum Procedures: spontaneous vaginal delivery Postpartum Procedures: none Complications-Operative and Postpartum: none HEMOGLOBIN  Date Value Ref Range Status  03/17/2015 10.6* 12.0 - 15.0 g/dL Final   HCT  Date Value Ref Range Status  03/17/2015 30.4* 36.0 - 46.0 % Final    Physical Exam:  General: alert, cooperative, appears stated age and no distress Lochia: appropriate Uterine Fundus: firm Incision: n/a DVT Evaluation: No evidence of DVT seen on physical exam. Negative Homan's sign. No cords or calf tenderness.  Discharge Diagnoses: Term Pregnancy-delivered  Discharge Information: Date: 03/19/2015 Activity: pelvic rest Diet: routine Medications: PNV, Ibuprofen and Percocet Condition: stable and improved Instructions: refer to practice specific booklet Discharge to: home   Newborn Data: Live born female  Birth Weight: 7 lb 2.6 oz (3249 g) APGAR: 7, 9  Home with mother.  Wyvonnia Dusky DARLENE 03/19/2015, 6:27 AM

## 2015-03-20 ENCOUNTER — Inpatient Hospital Stay (HOSPITAL_COMMUNITY): Admission: RE | Admit: 2015-03-20 | Payer: 59 | Source: Ambulatory Visit

## 2015-04-27 ENCOUNTER — Encounter: Payer: Self-pay | Admitting: Obstetrics & Gynecology

## 2015-04-27 ENCOUNTER — Ambulatory Visit (INDEPENDENT_AMBULATORY_CARE_PROVIDER_SITE_OTHER): Payer: 59 | Admitting: Obstetrics & Gynecology

## 2015-04-27 DIAGNOSIS — Z3042 Encounter for surveillance of injectable contraceptive: Secondary | ICD-10-CM

## 2015-04-27 LAB — POCT PREGNANCY, URINE: PREG TEST UR: NEGATIVE

## 2015-04-27 MED ORDER — MEDROXYPROGESTERONE ACETATE 150 MG/ML IM SUSP
150.0000 mg | INTRAMUSCULAR | Status: DC
  Administered 2015-04-27: 150 mg via INTRAMUSCULAR

## 2015-04-27 NOTE — Progress Notes (Signed)
  Subjective:     Suzanne Goodwin is a 19 y.o. G31P1011 female who presents for a postpartum visit. She is 6 weeks postpartum following a spontaneous vaginal delivery. I have fully reviewed the prenatal and intrapartum course; no complications. The delivery was at 40 5/7 gestational weeks.  Anesthesia: epidural. Postpartum course has been uncomplicated. Baby's course has been uncomplicated. Baby is feeding by bottle. Bleeding no bleeding previously but started period on 04/27/15. Bowel function is normal. Bladder function is normal. Patient is not sexually active. Contraception method is oral progesterone-only contraceptive but desires Depo Provera. Postpartum depression screening: negative.  The following portions of the patient's history were reviewed and updated as appropriate: allergies, current medications, past family history, past medical history, past social history, past surgical history and problem list.  Review of Systems Pertinent items are noted in HPI.   Objective:    BP 110/73 mmHg  Pulse 72  Ht  (1.575 m)  Wt 122 lb 8 oz (55.566 kg)  BMI 22.40 kg/m2  LMP 04/25/2015  General:  alert and no distress   Breasts:  deferred  Lungs: clear to auscultation bilaterally  Heart:  regular rate and rhythm  Abdomen: soft, non-tender; bowel sounds normal; no masses,  no organomegaly   Pelvic:  Currently on period, healed left periuretheral laceration        Assessment:   Normal postpartum exam. Has received Gardasil series.   Plan:   1. Contraception: Depo-Provera injections given today.  Repeat in 3 months 2. Follow up in: 3 months for contraception or as needed.     Jaynie Collins, MD, FACOG Attending Obstetrician & Gynecologist Center for [employer], [facility]

## 2015-04-27 NOTE — Patient Instructions (Signed)

## 2015-04-30 ENCOUNTER — Encounter: Payer: Self-pay | Admitting: Obstetrics & Gynecology

## 2015-05-01 ENCOUNTER — Encounter: Payer: Self-pay | Admitting: Obstetrics & Gynecology

## 2015-06-07 ENCOUNTER — Telehealth: Payer: Self-pay | Admitting: *Deleted

## 2015-06-07 NOTE — Telephone Encounter (Signed)
Patient called and stated that she thinks she is having some depression issues. Patient denies thoughts of harming self and others. I gave patient information for [facility] and she stated that she will call them now. Patient appreciative and had no further questions or concerns. Advised her that if she starts to feel like she wants to harm herself or anyone else to call 911.

## 2015-07-13 ENCOUNTER — Ambulatory Visit: Payer: 59 | Admitting: Internal Medicine

## 2015-07-16 ENCOUNTER — Ambulatory Visit (INDEPENDENT_AMBULATORY_CARE_PROVIDER_SITE_OTHER): Payer: 59 | Admitting: *Deleted

## 2015-07-16 DIAGNOSIS — Z3042 Encounter for surveillance of injectable contraceptive: Secondary | ICD-10-CM

## 2015-07-16 MED ORDER — MEDROXYPROGESTERONE ACETATE 150 MG/ML IM SUSP
150.0000 mg | Freq: Once | INTRAMUSCULAR | Status: AC
  Administered 2015-07-16: 150 mg via INTRAMUSCULAR

## 2015-07-27 ENCOUNTER — Ambulatory Visit: Payer: 59 | Admitting: Internal Medicine

## 2015-08-21 ENCOUNTER — Emergency Department (INDEPENDENT_AMBULATORY_CARE_PROVIDER_SITE_OTHER): Payer: 59

## 2015-08-21 ENCOUNTER — Encounter (HOSPITAL_COMMUNITY): Payer: Self-pay | Admitting: Emergency Medicine

## 2015-08-21 ENCOUNTER — Emergency Department (INDEPENDENT_AMBULATORY_CARE_PROVIDER_SITE_OTHER)
Admission: EM | Admit: 2015-08-21 | Discharge: 2015-08-21 | Disposition: A | Discharge status: Home / Self Care | Payer: 59 | Source: Home / Self Care | Attending: Emergency Medicine | Admitting: Emergency Medicine

## 2015-08-21 DIAGNOSIS — R1084 Generalized abdominal pain: Secondary | ICD-10-CM | POA: Diagnosis not present

## 2015-08-21 DIAGNOSIS — K5901 Slow transit constipation: Secondary | ICD-10-CM

## 2015-08-21 LAB — POCT URINALYSIS DIP (DEVICE)
BILIRUBIN URINE: NEGATIVE
Glucose, UA: NEGATIVE mg/dL
KETONES UR: NEGATIVE mg/dL
LEUKOCYTES UA: NEGATIVE
Nitrite: NEGATIVE
Protein, ur: NEGATIVE mg/dL
SPECIFIC GRAVITY, URINE: 1.02 (ref 1.005–1.030)
Urobilinogen, UA: 0.2 mg/dL (ref 0.0–1.0)
pH: 6 (ref 5.0–8.0)

## 2015-08-21 LAB — POCT PREGNANCY, URINE: Preg Test, Ur: NEGATIVE

## 2015-08-21 MED ORDER — POLYETHYLENE GLYCOL 3350 17 GM/SCOOP PO POWD: ORAL | Status: DC

## 2015-08-21 NOTE — Discharge Instructions (Signed)
Constipation, Adult Miralax as directed, may need 2 glasses first day to start out. Increase fiber in diet Read all of your instructions Constipation is when a person has fewer than three bowel movements a week, has difficulty having a bowel movement, or has stools that are dry, hard, or larger than normal. As people grow older, constipation is more common. A low-fiber diet, not taking in enough fluids, and taking certain medicines may make constipation worse.  CAUSES   Certain medicines, such as antidepressants, pain medicine, iron supplements, antacids, and water pills.   Certain diseases, such as diabetes, irritable bowel syndrome (IBS), thyroid disease, or depression.   Not drinking enough water.   Not eating enough fiber-rich foods.   Stress or travel.   Lack of physical activity or exercise.   Ignoring the urge to have a bowel movement.   Using laxatives too much.  SIGNS AND SYMPTOMS   Having fewer than three bowel movements a week.   Straining to have a bowel movement.   Having stools that are hard, dry, or larger than normal.   Feeling full or bloated.   Pain in the lower abdomen.   Not feeling relief after having a bowel movement.  DIAGNOSIS  Your health care provider will take a medical history and perform a physical exam. Further testing may be done for severe constipation. Some tests may include:  A barium enema X-ray to examine your rectum, colon, and, sometimes, your small intestine.   A sigmoidoscopy to examine your lower colon.   A colonoscopy to examine your entire colon. TREATMENT  Treatment will depend on the severity of your constipation and what is causing it. Some dietary treatments include drinking more fluids and eating more fiber-rich foods. Lifestyle treatments may include regular exercise. If these diet and lifestyle recommendations do not help, your health care provider may recommend taking over-the-counter laxative medicines to  help you have bowel movements. Prescription medicines may be prescribed if over-the-counter medicines do not work.  HOME CARE INSTRUCTIONS   Eat foods that have a lot of fiber, such as fruits, vegetables, whole grains, and beans.  Limit foods high in fat and processed sugars, such as french fries, hamburgers, cookies, candies, and soda.   A fiber supplement may be added to your diet if you cannot get enough fiber from foods.   Drink enough fluids to keep your urine clear or pale yellow.   Exercise regularly or as directed by your health care provider.   Go to the restroom when you have the urge to go. Do not hold it.   Only take over-the-counter or prescription medicines as directed by your health care provider. Do not take other medicines for constipation without talking to your health care provider first.  SEEK IMMEDIATE MEDICAL CARE IF:   You have bright red blood in your stool.   Your constipation lasts for more than 4 days or gets worse.   You have abdominal or rectal pain.   You have thin, pencil-like stools.   You have unexplained weight loss. MAKE SURE YOU:   Understand these instructions.  Will watch your condition.  Will get help right away if you are not doing well or get worse.   This information is not intended to replace advice given to you by your health care provider. Make sure you discuss any questions you have with your health care provider.   Document Released: 06/20/2004 Document Revised: 10/13/2014 Document Reviewed: 07/04/2013 Elsevier Interactive Patient Education [employer]  Abdominal Pain, Adult Many things can cause abdominal pain. Usually, abdominal pain is not caused by a disease and will improve without treatment. It can often be observed and treated at home. Your health care provider will do a physical exam and possibly order blood tests and X-rays to help determine the seriousness of your pain. However, in many cases, more  time must pass before a clear cause of the pain can be found. Before that point, your health care provider may not know if you need more testing or further treatment. HOME CARE INSTRUCTIONS Monitor your abdominal pain for any changes. The following actions may help to alleviate any discomfort you are experiencing:  Only take over-the-counter or prescription medicines as directed by your health care provider.  Do not take laxatives unless directed to do so by your health care provider.  Try a clear liquid diet (broth, tea, or water) as directed by your health care provider. Slowly move to a bland diet as tolerated. SEEK MEDICAL CARE IF:  You have unexplained abdominal pain.  You have abdominal pain associated with nausea or diarrhea.  You have pain when you urinate or have a bowel movement.  You experience abdominal pain that wakes you in the night.  You have abdominal pain that is worsened or improved by eating food.  You have abdominal pain that is worsened with eating fatty foods.  You have a fever. SEEK IMMEDIATE MEDICAL CARE IF:  Your pain does not go away within 2 hours.  You keep throwing up (vomiting).  Your pain is felt only in portions of the abdomen, such as the right side or the left lower portion of the abdomen.  You pass bloody or black tarry stools. MAKE SURE YOU:  Understand these instructions.  Will watch your condition.  Will get help right away if you are not doing well or get worse.   This information is not intended to replace advice given to you by your health care provider. Make sure you discuss any questions you have with your health care provider.   Document Released: 07/02/2005 Document Revised: 06/13/2015 Document Reviewed: 06/01/2013 Elsevier Interactive Patient Education 2016 [employer] Inc[employer]  High-Fiber Diet Fiber, also called dietary fiber, is a type of carbohydrate found in fruits, vegetables, whole grains, and beans. A high-fiber diet can  have many health benefits. Your health care provider may recommend a high-fiber diet to help:  Prevent constipation. Fiber can make your bowel movements more regular.  Lower your cholesterol.  Relieve hemorrhoids, uncomplicated diverticulosis, or irritable bowel syndrome.  Prevent overeating as part of a weight-loss plan.  Prevent heart disease, type 2 diabetes, and certain cancers. WHAT IS MY PLAN? The recommended daily intake of fiber includes:  38 grams for men under age 20.  30 grams for men over age 10050.  25 grams for women under age 20.  21 grams for women over age 20. You can get the recommended daily intake of dietary fiber by eating a variety of fruits, vegetables, grains, and beans. Your health care provider may also recommend a fiber supplement if it is not possible to get enough fiber through your diet. WHAT DO I NEED TO KNOW ABOUT A HIGH-FIBER DIET?  Fiber supplements have not been widely studied for their effectiveness, so it is better to get fiber through food sources.  Always check the fiber content on thenutrition facts label of any prepackaged food. Look for foods that contain at least 5 grams of fiber per serving.  Ask  your dietitian if you have questions about specific foods that are related to your condition, especially if those foods are not listed in the following section.  Increase your daily fiber consumption gradually. Increasing your intake of dietary fiber too quickly may cause bloating, cramping, or gas.  Drink plenty of water. Water helps you to digest fiber. WHAT FOODS CAN I EAT? Grains Whole-grain breads. Multigrain cereal. Oats and oatmeal. Brown rice. Barley. Bulgur wheat. Millet. Bran muffins. Popcorn. Rye wafer crackers. Vegetables Sweet potatoes. Spinach. Kale. Artichokes. Cabbage. Broccoli. Green peas. Carrots. Squash. Fruits Berries. Pears. Apples. Oranges. Avocados. Prunes and raisins. Dried figs. Meats and Other Protein Sources Navy,  kidney, pinto, and soy beans. Split peas. Lentils. Nuts and seeds. Dairy Fiber-fortified yogurt. Beverages Fiber-fortified soy milk. Fiber-fortified orange juice. Other Fiber bars. The items listed above may not be a complete list of recommended foods or beverages. Contact your dietitian for more options. WHAT FOODS ARE NOT RECOMMENDED? Grains White bread. Pasta made with refined flour. White rice. Vegetables Fried potatoes. Canned vegetables. Well-cooked vegetables.  Fruits Fruit juice. Cooked, strained fruit. Meats and Other Protein Sources Fatty cuts of meat. Fried Environmental education officer or fried fish. Dairy Milk. Yogurt. Cream cheese. Sour cream. Beverages Soft drinks. Other Cakes and pastries. Butter and oils. The items listed above may not be a complete list of foods and beverages to avoid. Contact your dietitian for more information. WHAT ARE SOME TIPS FOR INCLUDING HIGH-FIBER FOODS IN MY DIET?  Eat a wide variety of high-fiber foods.  Make sure that half of all grains consumed each day are whole grains.  Replace breads and cereals made from refined flour or white flour with whole-grain breads and cereals.  Replace white rice with brown rice, bulgur wheat, or millet.  Start the day with a breakfast that is high in fiber, such as a cereal that contains at least 5 grams of fiber per serving.  Use beans in place of meat in soups, salads, or pasta.  Eat high-fiber snacks, such as berries, raw vegetables, nuts, or popcorn.   This information is not intended to replace advice given to you by your health care provider. Make sure you discuss any questions you have with your health care provider.   Document Released: 09/22/2005 Document Revised: 10/13/2014 Document Reviewed: 03/07/2014 Elsevier Interactive Patient Education [employer]

## 2015-08-21 NOTE — ED Provider Notes (Signed)
CSN: [id number]     Arrival date & time 08/21/15  1300 History   First MD Initiated Contact with Patient 08/21/15 1312     Chief Complaint  Patient presents with  . Abdominal Pain   (Consider location/radiation/quality/duration/timing/severity/associated sxs/prior Treatment) HPI Comments: 19 year old female complaining of a 2-1/2 month history of pain in the upper and lower abdomen. She states it comes and goes. She describes it not as a cramp like a pencil in her abdomen. She states it may last 60-90 minutes at a time and occur 0-3 times a day. She states she is having normal bowel movements and no straining. Denies pelvic pain or abnormal vaginal discharge. Denies vomiting although last night she had nausea. Her appetite has decreased. She brought with her into the exam room a box of [employer] chicken and a large soda drink.   Past Medical History  Diagnosis Date  . Anxiety    Past Surgical History  Procedure Laterality Date  . Wisdom tooth extraction     Family History  Problem Relation Age of Onset  . Hypertension Mother   . Hypertension Father   . Diabetes Paternal Grandmother    Social History  Substance Use Topics  . Smoking status: Former Smoker -- 0.10 packs/day for 1 years    Quit date: 07/26/2014  . Smokeless tobacco: Never Used  . Alcohol Use: No   OB History    Gravida Para Term Preterm AB TAB SAB Ectopic Multiple Living   2 1 1  0 1 0 1 0 0 1     Review of Systems  Constitutional: Positive for activity change. Negative for fever and fatigue.  HENT: Negative.   Respiratory: Negative.  Negative for cough and shortness of breath.   Cardiovascular: Negative.   Gastrointestinal: Positive for abdominal pain. Negative for vomiting, diarrhea, constipation and blood in stool.  Genitourinary: Negative.   Musculoskeletal: Negative.   Skin: Negative.   Neurological: Negative.     Allergies  Grapeseed extract  Home Medications   Prior to Admission medications    Medication Sig Start Date End Date Taking? Authorizing Provider  oxyCODONE-acetaminophen (PERCOCET/ROXICET) 5-325 MG per tablet Take 1 tablet by mouth every 4 (four) hours as needed for moderate pain. 03/19/15  Yes Montez MoritaMarie D Lawson, CNM  cetirizine (ZYRTEC) 10 MG tablet Take 10 mg by mouth daily.    Historical Provider, MD  flintstones complete (FLINTSTONES) 60 MG chewable tablet Chew 2 tablets by mouth daily.     Historical Provider, MD  norethindrone (NOR-QD) 0.35 MG tablet Take 1 tablet (0.35 mg total) by mouth daily. 03/19/15   Montez MoritaMarie D Lawson, CNM  polyethylene glycol powder (GLYCOLAX/MIRALAX) powder Take 17 grams in 6-8 oz of liquid daily prn constipation. 08/21/15   Hayden Rasmussenavid Terese Heier, NP   Meds Ordered and Administered this Visit  Medications - No data to display  BP 118/75 mmHg  Pulse 70  Temp(Src) 99.3 F (37.4 C) (Oral)  Resp 16  SpO2 100% No data found.   Physical Exam  Constitutional: She is oriented to person, place, and time. She appears well-developed and well-nourished. No distress.  Eyes: EOM are normal.  Neck: Normal range of motion. Neck supple.  Cardiovascular: Normal rate.   Pulmonary/Chest: Effort normal. No respiratory distress.  Abdominal: Soft. Bowel sounds are normal. She exhibits no distension and no mass. There is tenderness. There is guarding. There is no rebound.  Tenderness with guarding is primarily located in the epigastrium and right hemiabdomen and concentrated in the right  lower quadrant. Light palpation produces guarding. Percussion reveals gas  across the upper abdomen and in the umbilical area.   Musculoskeletal: She exhibits no edema.  Neurological: She is alert and oriented to person, place, and time. She exhibits normal muscle tone.  Skin: Skin is warm and dry.  Psychiatric: She has a normal mood and affect.  Nursing note and vitals reviewed.   ED Course  Procedures (including critical care time)  Labs Review Labs Reviewed  POCT URINALYSIS DIP  (DEVICE) - Abnormal; Notable for the following:    Hgb urine dipstick TRACE (*)    All other components within normal limits  POCT PREGNANCY, URINE   Results for orders placed or performed during the hospital encounter of 08/21/15  POCT urinalysis dip (device)  Result Value Ref Range   Glucose, UA NEGATIVE NEGATIVE mg/dL   Bilirubin Urine NEGATIVE NEGATIVE   Ketones, ur NEGATIVE NEGATIVE mg/dL   Specific Gravity, Urine 1.020 1.005 - 1.030   Hgb urine dipstick TRACE (A) NEGATIVE   pH 6.0 5.0 - 8.0   Protein, ur NEGATIVE NEGATIVE mg/dL   Urobilinogen, UA 0.2 0.0 - 1.0 mg/dL   Nitrite NEGATIVE NEGATIVE   Leukocytes, UA NEGATIVE NEGATIVE  Pregnancy, urine POC  Result Value Ref Range   Preg Test, Ur NEGATIVE NEGATIVE     Imaging Review Dg Abd 1 View  08/21/2015  CLINICAL DATA:  Right upper and lower quadrant pain 2-3 months. Fever and diarrhea 1 day a week ago. EXAM: ABDOMEN - 1 VIEW COMPARISON:  None. FINDINGS: Bowel gas pattern is nonobstructive. No focal mass or mass effect. No abnormal calcifications. Subtle curvature of the lumbar spine convex left. Remaining bony structures and soft tissues are within normal. IMPRESSION: Nonobstructive bowel gas pattern. Electronically Signed   By: Elberta Fortis M.D.   On: 08/21/2015 14:06     Visual Acuity Review  Right Eye Distance:   Left Eye Distance:   Bilateral Distance:    Right Eye Near:   Left Eye Near:    Bilateral Near:         MDM   1. Generalized abdominal pain   2. Slow transit constipation    Miralax as directed, may need 2 glasses first day to start out. Increase fiber in diet Read all of your instructions Info on abdominal pain with red flags F/U with PCP as needed, if worse go to the ED    Hayden Rasmussen, NP 08/21/15 1423

## 2015-10-03 ENCOUNTER — Ambulatory Visit: Payer: 59

## 2015-11-22 ENCOUNTER — Ambulatory Visit (INDEPENDENT_AMBULATORY_CARE_PROVIDER_SITE_OTHER): Payer: 59

## 2015-11-22 DIAGNOSIS — Z3202 Encounter for pregnancy test, result negative: Secondary | ICD-10-CM | POA: Diagnosis not present

## 2015-11-22 DIAGNOSIS — Z3042 Encounter for surveillance of injectable contraceptive: Secondary | ICD-10-CM

## 2015-11-22 LAB — POCT PREGNANCY, URINE: Preg Test, Ur: NEGATIVE

## 2015-11-22 NOTE — Progress Notes (Signed)
Pt comes into today for Urine Pregnancy test. Test confirmed she is not pregnant at this time. Pt has had unprotected sex in the last two weeks so she will have to wait two more weeks before starting on Depo. Pt voice understanding

## 2016-01-01 ENCOUNTER — Ambulatory Visit: Payer: 59

## 2016-02-05 IMAGING — DX DG ABDOMEN 1V
1 series · 1 of 1 positions shown · non-contrast
Comparison: None.

CLINICAL DATA: Right upper and lower quadrant pain 2-3 months.
Fever and diarrhea 1 day a week ago.

EXAM:
ABDOMEN - 1 VIEW

[abdomen kub]
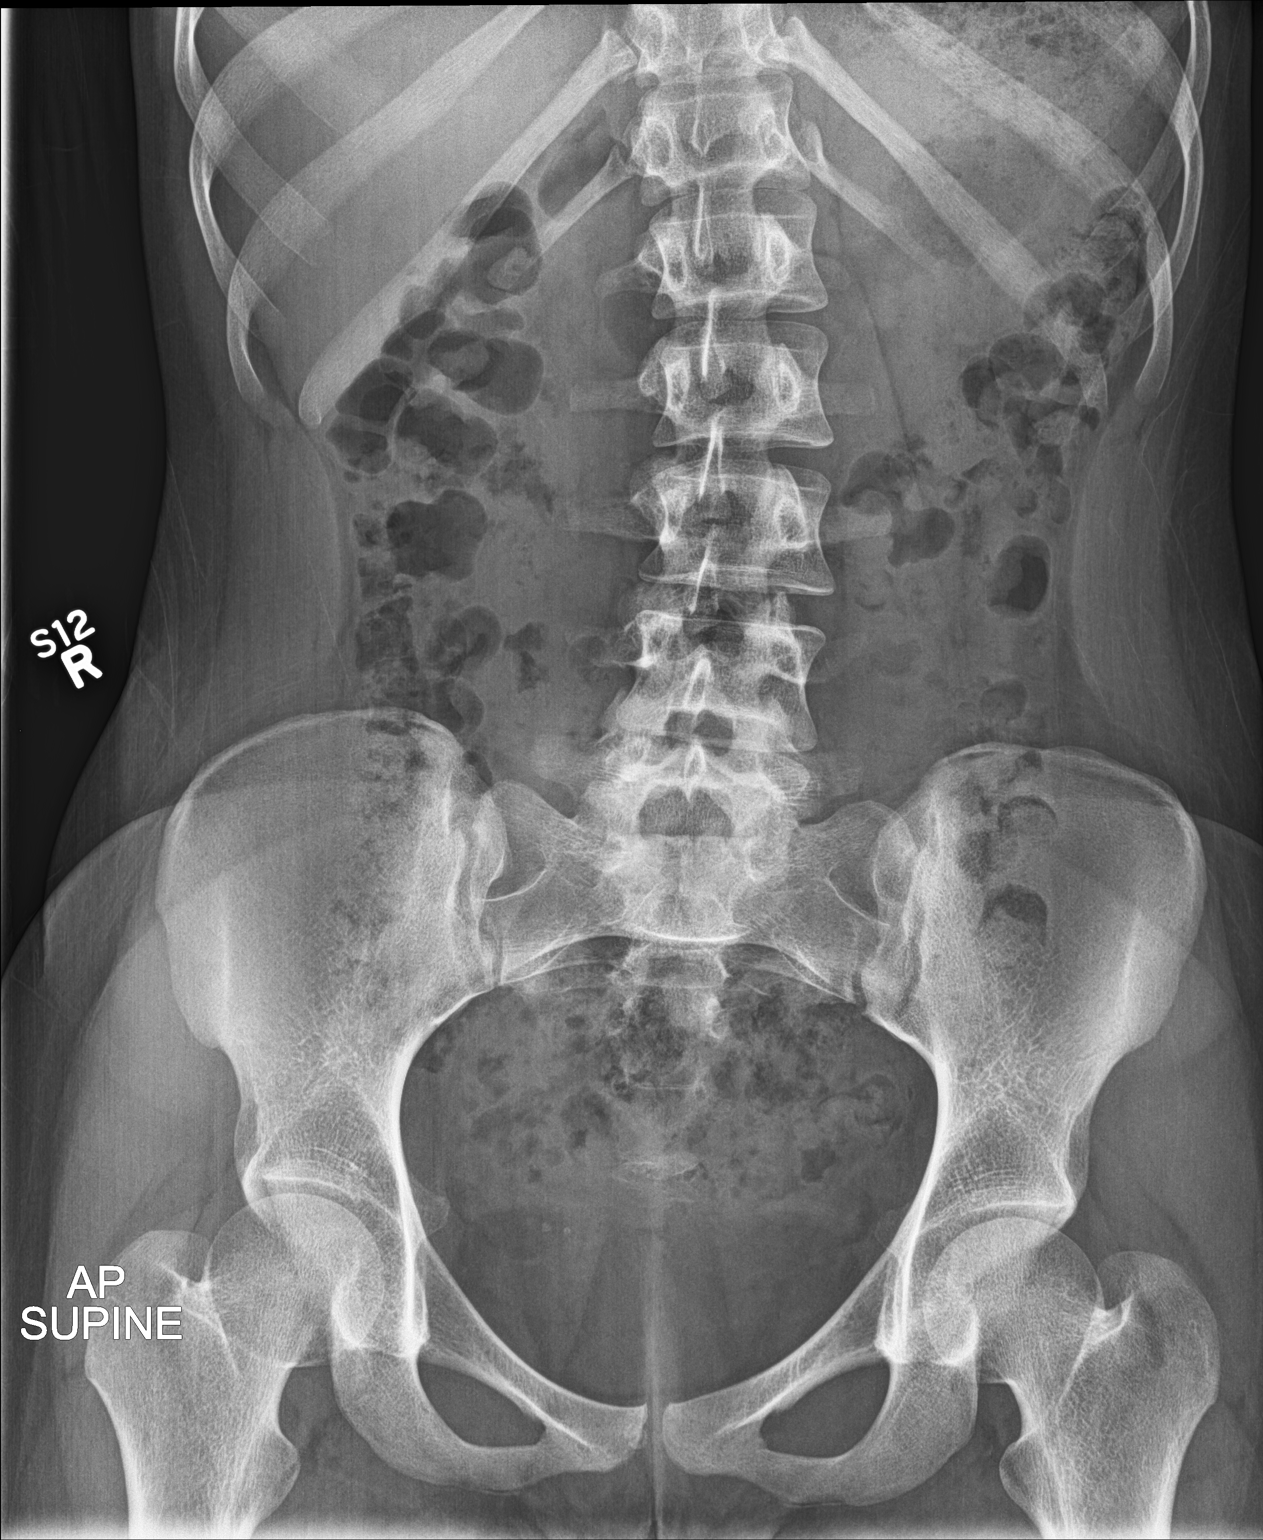

[1 of 1 positions shown; findings below may reference images not displayed]

FINDINGS: Bowel gas pattern is nonobstructive. No focal mass or mass effect.
No abnormal calcifications. Subtle curvature of the lumbar spine
convex left. Remaining bony structures and soft tissues are within
normal.
IMPRESSION: Nonobstructive bowel gas pattern.

## 2016-03-11 ENCOUNTER — Ambulatory Visit: Payer: 59 | Admitting: Obstetrics and Gynecology

## 2016-03-18 ENCOUNTER — Ambulatory Visit (INDEPENDENT_AMBULATORY_CARE_PROVIDER_SITE_OTHER): Payer: 59 | Admitting: Pulmonary Disease

## 2016-03-18 ENCOUNTER — Encounter: Payer: Self-pay | Admitting: Pulmonary Disease

## 2016-03-18 VITALS — BP 114/73 | HR 72 | Temp 97.5°F | Resp 18 | Ht 62.0 in | Wt 106.9 lb

## 2016-03-18 DIAGNOSIS — F418 Other specified anxiety disorders: Secondary | ICD-10-CM | POA: Diagnosis not present

## 2016-03-18 DIAGNOSIS — R109 Unspecified abdominal pain: Secondary | ICD-10-CM | POA: Insufficient documentation

## 2016-03-18 DIAGNOSIS — F419 Anxiety disorder, unspecified: Secondary | ICD-10-CM

## 2016-03-18 DIAGNOSIS — R1012 Left upper quadrant pain: Secondary | ICD-10-CM

## 2016-03-18 DIAGNOSIS — R1010 Upper abdominal pain, unspecified: Secondary | ICD-10-CM

## 2016-03-18 DIAGNOSIS — R1013 Epigastric pain: Secondary | ICD-10-CM

## 2016-03-18 DIAGNOSIS — R1011 Right upper quadrant pain: Secondary | ICD-10-CM | POA: Diagnosis not present

## 2016-03-18 MED ORDER — SERTRALINE HCL 25 MG PO TABS: 25.0000 mg | ORAL_TABLET | Freq: Every day | ORAL | Status: DC

## 2016-03-18 MED ORDER — RANITIDINE HCL 150 MG PO CAPS: 150.0000 mg | ORAL_CAPSULE | Freq: Two times a day (BID) | ORAL | Status: DC

## 2016-03-18 NOTE — Progress Notes (Signed)
Subjective:    Patient ID: Suzanne Goodwin, female    DOB: 04/05/1996, 19 y.o.   MRN: [id number]  HPI Suzanne Goodwin is a 20 year old woman with history of anxiety presenting [facility] and be evaluated for abdominal pain.  Had an baby a year ago. Abdominal pain started a few months after having the baby (August). Sharp pain lasting 2.5 weeks then resolves then comes back about 2.5 weeks later. LUQ pain and will move to other locations like RUQ and epigastric. Feels bloated and has tightness. During and right after eating, the pain gets worse. Not worse at night. Has acid reflux. Has not taken anything for pain. Drinking water does not help. Sprite does help a little. Has a BM every 1-2 days. Formed stools. No blood in stools. Does not take ibuprofen or other over the counter.   Has panic attacks. Was prescribed Xanax 3 years ago. GAD7 15 Very difficult. Feels some depression. Started after the 6 weeks post partum. Has had feelings of hurting herself before but nothing recently - in high school. She went to therapy. Didn't help much.  Review of Systems Constitutional: no fevers/chills Eyes: no vision changes Ears, nose, mouth, throat, and face: no cough Respiratory: no shortness of breath Cardiovascular: no chest pain Gastrointestinal: no nausea/vomiting Genitourinary: no dysuria, no hematuria Integument: +eczema Hematologic/lymphatic: no bleeding/bruising, no edema Musculoskeletal: no arthralgias, no myalgias Neurological: no paresthesias, no weakness  Meds: Current Outpatient Prescriptions  Medication Sig Dispense Refill  . cetirizine (ZYRTEC) 10 MG tablet Take 10 mg by mouth daily.    . ranitidine (ZANTAC) 150 MG capsule Take 1 capsule (150 mg total) by mouth 2 (two) times daily. Start taking if you start having abdominal pain again. 60 capsule 0  . sertraline (ZOLOFT) 25 MG tablet Take 1 tablet (25 mg total) by mouth daily. 30 tablet 1   Current  Facility-Administered Medications  Medication Dose Route Frequency Provider Last Rate Last Dose  . medroxyPROGESTERone (DEPO-PROVERA) injection 150 mg  150 mg Intramuscular Q90 days Tereso NewcomerUgonna A Anyanwu, MD   150 mg at 04/27/15 1024    Allergies: Allergies as of 03/18/2016 - Review Complete 03/18/2016  Allergen Reaction Noted  . Grapeseed extract [nutritional supplements]  11/08/2014   Past Medical History  Diagnosis Date  . Anxiety    Past Surgical History  Procedure Laterality Date  . Wisdom tooth extraction     Family History  Problem Relation Age of Onset  . Hypertension Mother   . Hypertension Father   . Diabetes Paternal Grandmother   . Alzheimer's disease Paternal Grandmother   . Asthma Brother   . Cancer Maternal Grandfather     stomach  . Cancer Paternal Grandfather [phone]    breast  . Asthma Brother    Social History   Social History  . Marital Status: Single    Spouse Name: N/A  . Number of Children: N/A  . Years of Education: N/A   Occupational History  . Not on file.   Social History Main Topics  . Smoking status: Former Smoker -- 1.00 packs/day for 3 years    Quit date: 02/04/2016  . Smokeless tobacco: Never Used  . Alcohol Use: No  . Drug Use: No     Comment: Last used in HS  . Sexual Activity: Not Currently    Birth Control/ Protection: None   Other Topics Concern  . Not on file   Social History Narrative      Objective:  Physical Exam Blood pressure 114/73, pulse 72, temperature 97.5 F (36.4 C), temperature source Oral, resp. rate 18, height  (1.575 m), weight 106 lb 14.4 oz (48.49 kg), last menstrual period 03/11/2016, SpO2 98 %, not currently breastfeeding. General Apperance: NAD HEENT: Normocephalic, atraumatic, anicteric sclera Neck: Supple, trachea midline Lungs: Clear to auscultation bilaterally. No wheezes, rhonchi or rales. Breathing comfortably Heart: Regular rate and rhythm, no murmur/rub/gallop Abdomen: Soft, nontender,  nondistended, no rebound/guarding Extremities: Warm and well perfused, no edema Skin: No rashes or lesions Neurologic: Alert and interactive. No gross deficits.    Assessment & Plan:  Please refer to problem based charting.

## 2016-03-18 NOTE — Patient Instructions (Signed)
Sertraline tablets What is this medicine? SERTRALINE (SER tra leen) is used to treat depression. It may also be used to treat obsessive compulsive disorder, panic disorder, post-trauma stress, premenstrual dysphoric disorder (PMDD) or social anxiety. This medicine may be used for other purposes; ask your health care provider or pharmacist if you have questions. What should I tell my health care provider before I take this medicine? They need to know if you have any of these conditions: -bipolar disorder or a family history of bipolar disorder -diabetes -glaucoma -heart disease -high blood pressure -history of irregular heartbeat -history of low levels of calcium, magnesium, or potassium in the blood -if you often drink alcohol -liver disease -receiving electroconvulsive therapy -seizures -suicidal thoughts, plans, or attempt; a previous suicide attempt by you or a family member -thyroid disease -an unusual or allergic reaction to sertraline, other medicines, foods, dyes, or preservatives -pregnant or trying to get pregnant -breast-feeding How should I use this medicine? Take this medicine by mouth with a glass of water. Follow the directions on the prescription label. You can take it with or without food. Take your medicine at regular intervals. Do not take your medicine more often than directed. Do not stop taking this medicine suddenly except upon the advice of your doctor. Stopping this medicine too quickly may cause serious side effects or your condition may worsen. A special MedGuide will be given to you by the pharmacist with each prescription and refill. Be sure to read this information carefully each time. Talk to your pediatrician regarding the use of this medicine in children. While this drug may be prescribed for children as young as 7 years for selected conditions, precautions do apply. Overdosage: If you think you have taken too much of this medicine contact a poison control  center or emergency room at once. NOTE: This medicine is only for you. Do not share this medicine with others. What if I miss a dose? If you miss a dose, take it as soon as you can. If it is almost time for your next dose, take only that dose. Do not take double or extra doses. What may interact with this medicine? Do not take this medicine with any of the following medications: -certain medicines for fungal infections like fluconazole, itraconazole, ketoconazole, posaconazole, voriconazole -cisapride -disulfiram -dofetilide -linezolid -MAOIs like Carbex, Eldepryl, Marplan, Nardil, and Parnate -metronidazole -methylene blue (injected into a vein) -pimozide -thioridazine -ziprasidone This medicine may also interact with the following medications: -alcohol -aspirin and aspirin-like medicines -certain medicines for depression, anxiety, or psychotic disturbances -certain medicines for irregular heart beat like flecainide, propafenone -certain medicines for migraine headaches like almotriptan, eletriptan, frovatriptan, naratriptan, rizatriptan, sumatriptan, zolmitriptan -certain medicines for sleep -certain medicines for seizures like carbamazepine, valproic acid, phenytoin -certain medicines that treat or prevent blood clots like warfarin, enoxaparin, dalteparin -cimetidine -digoxin -diuretics -fentanyl -furazolidone -isoniazid -lithium -NSAIDs, medicines for pain and inflammation, like ibuprofen or naproxen -other medicines that prolong the QT interval (cause an abnormal heart rhythm) -procarbazine -rasagiline -supplements like St. John's wort, kava kava, valerian -tolbutamide -tramadol -tryptophan This list may not describe all possible interactions. Give your health care provider a list of all the medicines, herbs, non-prescription drugs, or dietary supplements you use. Also tell them if you smoke, drink alcohol, or use illegal drugs. Some items may interact with your  medicine. What should I watch for while using this medicine? Tell your doctor if your symptoms do not get better or if they get worse. Visit your doctor   or health care professional for regular checks on your progress. Because it may take several weeks to see the full effects of this medicine, it is important to continue your treatment as prescribed by your doctor. Patients and their families should watch out for new or worsening thoughts of suicide or depression. Also watch out for sudden changes in feelings such as feeling anxious, agitated, panicky, irritable, hostile, aggressive, impulsive, severely restless, overly excited and hyperactive, or not being able to sleep. If this happens, especially at the beginning of treatment or after a change in dose, call your health care professional. You may get drowsy or dizzy. Do not drive, use machinery, or do anything that needs mental alertness until you know how this medicine affects you. Do not stand or sit up quickly, especially if you are an older patient. This reduces the risk of dizzy or fainting spells. Alcohol may interfere with the effect of this medicine. Avoid alcoholic drinks. Your mouth may get dry. Chewing sugarless gum or sucking hard candy, and drinking plenty of water may help. Contact your doctor if the problem does not go away or is severe. What side effects may I notice from receiving this medicine? Side effects that you should report to your doctor or health care professional as soon as possible: -allergic reactions like skin rash, itching or hives, swelling of the face, lips, or tongue -black or bloody stools, blood in the urine or vomit -fast, irregular heartbeat -feeling faint or lightheaded, falls -hallucination, loss of contact with reality -seizures -suicidal thoughts or other mood changes -unusual bleeding or bruising -unusually weak or tired -vomiting Side effects that usually do not require medical attention (report to your  doctor or health care professional if they continue or are bothersome): -change in appetite -change in sex drive or performance -diarrhea -increased sweating -indigestion, nausea -tremors This list may not describe all possible side effects. Call your doctor for medical advice about side effects. You may report side effects to FDA at 1-800-FDA-1088. Where should I keep my medicine? Keep out of the reach of children. Store at room temperature between 15 and 30 degrees C (59 and 86 degrees F). Throw away any unused medicine after the expiration date. NOTE: This sheet is a summary. It may not cover all possible information. If you have questions about this medicine, talk to your doctor, pharmacist, or health care provider.    2016, Elsevier/Gold Standard. (2013-04-19 12:57:35)  

## 2016-03-18 NOTE — Assessment & Plan Note (Signed)
Assessment: Intermittent abdominal pain that is mainly in the LUQ but can also occur in epigastric or RUQ regions. Worse during and after eating. Probable GERD, but symptomatic cholelithiasis is on the differential given her weight change post partum.  Plan: Ranitidine 150mg  BID trial when abdominal pain recurs If this does not resolve abdominal pain, would get CMP, RUQ US.

## 2016-03-18 NOTE — Assessment & Plan Note (Addendum)
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 Has some depression but anxiety predominant.  Plan: Sertraline 25mg  daily. Increase by 25-50mg  increments if not controlled at follow up. Follow up in 4 weeks

## 2016-03-19 NOTE — Progress Notes (Signed)
[facility] Attending  Case discussed with Dr. Krall at the time of the visit.  We reviewed the resident's history and exam and pertinent patient test results.  I agree with the assessment, diagnosis, and plan of care documented in the resident's note.  

## 2016-06-19 ENCOUNTER — Encounter (HOSPITAL_COMMUNITY): Payer: Self-pay | Admitting: *Deleted

## 2016-06-19 ENCOUNTER — Inpatient Hospital Stay (HOSPITAL_COMMUNITY)
Admission: AD | Admit: 2016-06-19 | Discharge: 2016-06-19 | Disposition: A | Discharge status: Home / Self Care | Payer: 59 | Source: Ambulatory Visit | Attending: Obstetrics & Gynecology | Admitting: Obstetrics & Gynecology

## 2016-06-19 DIAGNOSIS — R300 Dysuria: Secondary | ICD-10-CM | POA: Diagnosis not present

## 2016-06-19 LAB — URINALYSIS, ROUTINE W REFLEX MICROSCOPIC
BILIRUBIN URINE: NEGATIVE
GLUCOSE, UA: NEGATIVE mg/dL
KETONES UR: NEGATIVE mg/dL
Nitrite: NEGATIVE
PH: 6 (ref 5.0–8.0)
PROTEIN: NEGATIVE mg/dL
Specific Gravity, Urine: 1.01 (ref 1.005–1.030)

## 2016-06-19 LAB — URINE MICROSCOPIC-ADD ON

## 2016-06-19 LAB — POCT PREGNANCY, URINE: Preg Test, Ur: NEGATIVE

## 2016-06-19 NOTE — Progress Notes (Signed)
Patient walked out of room and said she needed to leave.  She had not yet been seen by provider and urine sample still in process.  Patient left before signing the AMA form.

## 2016-06-19 NOTE — MAU Note (Signed)
Pt presents to MAU with complaints of pian with urination. Denies any vaginal bleeding or abnormal discharge

## 2017-04-03 ENCOUNTER — Ambulatory Visit (HOSPITAL_COMMUNITY)
Admission: EM | Admit: 2017-04-03 | Discharge: 2017-04-03 | Disposition: A | Discharge status: Home / Self Care | Payer: 59 | Attending: Family Medicine | Admitting: Family Medicine

## 2017-04-03 ENCOUNTER — Encounter (HOSPITAL_COMMUNITY): Payer: Self-pay

## 2017-04-03 DIAGNOSIS — F419 Anxiety disorder, unspecified: Secondary | ICD-10-CM | POA: Diagnosis not present

## 2017-04-03 DIAGNOSIS — Z87891 Personal history of nicotine dependence: Secondary | ICD-10-CM | POA: Insufficient documentation

## 2017-04-03 DIAGNOSIS — Z202 Contact with and (suspected) exposure to infections with a predominantly sexual mode of transmission: Secondary | ICD-10-CM | POA: Diagnosis not present

## 2017-04-03 DIAGNOSIS — Z113 Encounter for screening for infections with a predominantly sexual mode of transmission: Secondary | ICD-10-CM | POA: Diagnosis not present

## 2017-04-03 LAB — POCT PREGNANCY, URINE: PREG TEST UR: NEGATIVE

## 2017-04-03 MED ORDER — CEFTRIAXONE SODIUM 250 MG IJ SOLR
INTRAMUSCULAR | Status: AC
  Filled 2017-04-03: qty 250

## 2017-04-03 MED ORDER — AZITHROMYCIN 250 MG PO TABS
1000.0000 mg | ORAL_TABLET | Freq: Once | ORAL | Status: AC
  Administered 2017-04-03: 1000 mg via ORAL

## 2017-04-03 MED ORDER — LIDOCAINE HCL (PF) 1 % IJ SOLN
INTRAMUSCULAR | Status: AC
  Filled 2017-04-03: qty 2

## 2017-04-03 MED ORDER — AZITHROMYCIN 250 MG PO TABS
ORAL_TABLET | ORAL | Status: AC
  Filled 2017-04-03: qty 4

## 2017-04-03 MED ORDER — CEFTRIAXONE SODIUM 250 MG IJ SOLR
250.0000 mg | Freq: Once | INTRAMUSCULAR | Status: AC
  Administered 2017-04-03: 250 mg via INTRAMUSCULAR

## 2017-04-03 NOTE — Discharge Instructions (Signed)
You have been tested today for STI's: Gonorrhea, chlamydia, trichomonas, syphilis and HIV.  You've been treated for gonorrhea and chlamydia. You'll be called with test results. Please avoid sexual contact for the next 7 days. Please use condoms with every sexual encounter.

## 2017-04-03 NOTE — ED Provider Notes (Signed)
[facility]    CSN: [id number] Arrival date & time: 04/03/17  1719     History   Chief Complaint Chief Complaint  Patient presents with  . Exposure to STD    HPI Suzanne Goodwin is a 21 y.o. female.   HPI 21 year old female with past medical history significant for anxiety presents with complaint of chlamydia exposure. She reports having unprotected sex for times for the last 6 weeks with one female partner. She admits that she has vaginal discharge. She denies pelvic pain, vaginal bleeding and genital sores.  She request pelvic screening for STI as well as pregnancy. She's currently not on birth control. She reports she was intolerant of Depo-Medrol due to irregular bleeding and hair loss.  Past Medical History:  Diagnosis Date  . Anxiety     Patient Active Problem List   Diagnosis Date Noted  . Abdominal pain 03/18/2016  . Anxiety 11/08/2014    Past Surgical History:  Procedure Laterality Date  . WISDOM TOOTH EXTRACTION      OB History    Gravida Para Term Preterm AB Living   2 1 1  0 1 1   SAB TAB Ectopic Multiple Live Births   1 0 0 0 1       Home Medications    Prior to Admission medications   Medication Sig Start Date End Date Taking? Authorizing Provider  cetirizine (ZYRTEC) 10 MG tablet Take 10 mg by mouth daily.    [provider]  ranitidine (ZANTAC) 150 MG capsule Take 1 capsule (150 mg total) by mouth 2 (two) times daily. Start taking if you start having abdominal pain again. 03/18/16   Lora PaulaKrall, Jennifer T, MD  sertraline (ZOLOFT) 25 MG tablet Take 1 tablet (25 mg total) by mouth daily. 03/18/16   Lora PaulaKrall, Jennifer T, MD    Family History Family History  Problem Relation Age of Onset  . Hypertension Mother   . Hypertension Father   . Diabetes Paternal Grandmother   . Alzheimer's disease Paternal Grandmother   . Asthma Brother   . Cancer Maternal Grandfather        stomach  . Cancer Paternal Grandfather [id number]       breast  .  Asthma Brother     Social History Social History  Substance Use Topics  . Smoking status: Former Smoker    Packs/day: 1.00    Years: 3.00    Quit date: 02/04/2016  . Smokeless tobacco: Never Used  . Alcohol use No     Allergies   Grapeseed extract [nutritional supplements]   Review of Systems Review of Systems  Constitutional: Negative for chills and fever.  Eyes: Negative for visual disturbance.  Respiratory: Negative for shortness of breath.   Cardiovascular: Negative for chest pain.  Gastrointestinal: Negative for abdominal pain and blood in stool.  Genitourinary: Positive for vaginal discharge.  Musculoskeletal: Negative for arthralgias and back pain.  Skin: Negative for rash.  Allergic/Immunologic: Negative for immunocompromised state.  Hematological: Negative for adenopathy. Does not bruise/bleed easily.  Psychiatric/Behavioral: Negative for dysphoric mood and suicidal ideas.     Physical Exam Triage Vital Signs ED Triage Vitals  Enc Vitals Group     BP 04/03/17 1746 111/75     Pulse Rate 04/03/17 1746 61     Resp 04/03/17 1746 16     Temp 04/03/17 1746 98.3 F (36.8 C)     Temp Source 04/03/17 1746 Oral     SpO2 04/03/17 1746 100 %  Weight --      Height --      Head Circumference --      Peak Flow --      Pain Score 04/03/17 1751 0     Pain Loc --      Pain Edu? --      Excl. in GC? --    No data found.   Updated Vital Signs BP 111/75 (BP Location: Left Arm)   Pulse 61   Temp 98.3 F (36.8 C) (Oral)   Resp 16   LMP 02/02/2017 Comment: Pt said she is irregular.   SpO2 100%   Visual Acuity Right Eye Distance:   Left Eye Distance:   Bilateral Distance:    Right Eye Near:   Left Eye Near:    Bilateral Near:     Physical Exam  Constitutional: She appears well-developed and well-nourished. No distress.  Cardiovascular: Normal rate, regular rhythm, normal heart sounds and intact distal pulses.   Pulmonary/Chest: Effort normal and breath  sounds normal.  Genitourinary: Uterus normal. Pelvic exam was performed with patient supine. There is no rash, tenderness or lesion on the right labia. There is no rash, tenderness or lesion on the left labia. Cervix exhibits no motion tenderness, no discharge and no friability. Vaginal discharge (moderate thin white ) found.  Musculoskeletal: She exhibits no edema.  Lymphadenopathy:       Right: No inguinal adenopathy present.       Left: No inguinal adenopathy present.  Skin: Skin is warm and dry. No rash noted.     UC Treatments / Results  Labs (all labs ordered are listed, but only abnormal results are displayed) Labs Reviewed  HIV ANTIBODY (ROUTINE TESTING)  RPR  POCT PREGNANCY, URINE  CERVICOVAGINAL ANCILLARY ONLY    EKG  EKG Interpretation None       Radiology No results found.  Procedures Procedures (including critical care time)  Medications Ordered in UC Medications  cefTRIAXone (ROCEPHIN) injection 250 mg (not administered)  azithromycin (ZITHROMAX) tablet 1,000 mg (not administered)     Initial Impression / Assessment and Plan / UC Course  I have reviewed the triage vital signs and the nursing notes.  Pertinent labs & imaging results that were available during my care of the patient were reviewed by me and considered in my medical decision making (see chart for details).   patient with recent chlamydia exposure. No cervical motion tenderness on uterine tenderness on bimanual exam.  Patient is amenable to treatment with azithromycin and Rocephin.   Patient counseled regarding safe sex practices and birth control options. She is counseled to use condoms with every sexual encounter. She is she is advised to establish with primary care provider to discuss birth control options. Final diagnoses:  STD exposure    New Prescriptions New Prescriptions   No medications on file     Dessa Phi, MD 04/03/17 1857

## 2017-04-03 NOTE — ED Notes (Signed)
Call back number verified and updated in EPIC... Adv pt to not have SI until lab results comeback neg.... Also adv pt lab results will be on MyChart; instructions given .... Pt verb understanding.   

## 2017-04-03 NOTE — ED Triage Notes (Signed)
Pt said her "friend" told her last night he tested positive for chlymadia and said she did have unprotected sex with him recently. Wanted to be check for all stds including blood work. Said she is having white discharge for a few weeks, no pain or odor.

## 2017-04-04 LAB — HIV ANTIBODY (ROUTINE TESTING W REFLEX): HIV SCREEN 4TH GENERATION: NONREACTIVE

## 2017-04-04 LAB — RPR: RPR: NONREACTIVE

## 2017-04-06 LAB — CERVICOVAGINAL ANCILLARY ONLY
BACTERIAL VAGINITIS: POSITIVE — AB
CANDIDA VAGINITIS: POSITIVE — AB
Chlamydia: POSITIVE — AB
NEISSERIA GONORRHEA: NEGATIVE
TRICH (WINDOWPATH): NEGATIVE

## 2017-04-07 ENCOUNTER — Telehealth (HOSPITAL_COMMUNITY): Payer: Self-pay | Admitting: Emergency Medicine

## 2017-04-07 MED ORDER — METRONIDAZOLE 500 MG PO TABS: 500.0000 mg | ORAL_TABLET | Freq: Two times a day (BID) | ORAL | 0 refills | Status: DC

## 2017-04-07 MED ORDER — FLUCONAZOLE 150 MG PO TABS: 150.0000 mg | ORAL_TABLET | Freq: Every day | ORAL | 0 refills | Status: DC

## 2017-04-07 NOTE — Telephone Encounter (Signed)
Pt called needing lab results... Pt ID'd properly... Results given Reports feeling better but still having vag d/c Pt tested positive for Chlam, yeast and BV Pt treated w/1,000 mg of Azithromycin and 250 mg of Rocephin Per Waylan RocherErin Phelps, PA... Ok to call in Flagyl (x7 days BID no refills) and Diflucan 150 mgs (#2 ) to pharmacy of choice Pt requested for us to e-Rx meds to [facility]([address] Adv pt if sx are not getting better to return or to f/u w/PCP Education on safe sex given Also adv pt to notify partner(s) [id number] Notified pt that lab results can be obtained through MyChart Pt verb understanding.

## 2017-11-25 ENCOUNTER — Ambulatory Visit: Payer: 59 | Admitting: Obstetrics & Gynecology

## 2018-02-05 ENCOUNTER — Emergency Department (HOSPITAL_COMMUNITY)
Admission: EM | Admit: 2018-02-05 | Discharge: 2018-02-05 | Disposition: A | Discharge status: Home / Self Care | Payer: 59 | Attending: Emergency Medicine | Admitting: Emergency Medicine

## 2018-02-05 ENCOUNTER — Emergency Department (HOSPITAL_COMMUNITY): Payer: 59

## 2018-02-05 ENCOUNTER — Other Ambulatory Visit: Payer: Self-pay

## 2018-02-05 ENCOUNTER — Encounter (HOSPITAL_COMMUNITY): Payer: Self-pay

## 2018-02-05 DIAGNOSIS — Z5320 Procedure and treatment not carried out because of patient's decision for unspecified reasons: Secondary | ICD-10-CM | POA: Diagnosis not present

## 2018-02-05 DIAGNOSIS — Z79899 Other long term (current) drug therapy: Secondary | ICD-10-CM | POA: Diagnosis not present

## 2018-02-05 DIAGNOSIS — M542 Cervicalgia: Secondary | ICD-10-CM | POA: Diagnosis not present

## 2018-02-05 DIAGNOSIS — M25552 Pain in left hip: Secondary | ICD-10-CM | POA: Insufficient documentation

## 2018-02-05 DIAGNOSIS — Y929 Unspecified place or not applicable: Secondary | ICD-10-CM | POA: Insufficient documentation

## 2018-02-05 DIAGNOSIS — R42 Dizziness and giddiness: Secondary | ICD-10-CM | POA: Diagnosis not present

## 2018-02-05 DIAGNOSIS — Z87891 Personal history of nicotine dependence: Secondary | ICD-10-CM | POA: Diagnosis not present

## 2018-02-05 DIAGNOSIS — S90511A Abrasion, right ankle, initial encounter: Secondary | ICD-10-CM | POA: Insufficient documentation

## 2018-02-05 DIAGNOSIS — Y939 Activity, unspecified: Secondary | ICD-10-CM | POA: Insufficient documentation

## 2018-02-05 DIAGNOSIS — Y999 Unspecified external cause status: Secondary | ICD-10-CM | POA: Insufficient documentation

## 2018-02-05 DIAGNOSIS — R51 Headache: Secondary | ICD-10-CM | POA: Diagnosis not present

## 2018-02-05 DIAGNOSIS — Z041 Encounter for examination and observation following transport accident: Secondary | ICD-10-CM | POA: Diagnosis present

## 2018-02-05 MED ORDER — IBUPROFEN 200 MG PO TABS: 600.0000 mg | ORAL_TABLET | Freq: Once | ORAL | Status: DC

## 2018-02-05 MED ORDER — TETANUS-DIPHTH-ACELL PERTUSSIS 5-2.5-18.5 LF-MCG/0.5 IM SUSP: 0.5000 mL | Freq: Once | INTRAMUSCULAR | Status: DC

## 2018-02-05 MED ORDER — IBUPROFEN 600 MG PO TABS: 600.0000 mg | ORAL_TABLET | Freq: Four times a day (QID) | ORAL | 0 refills | Status: DC | PRN

## 2018-02-05 MED ORDER — CYCLOBENZAPRINE HCL 10 MG PO TABS: 10.0000 mg | ORAL_TABLET | Freq: Two times a day (BID) | ORAL | 0 refills | Status: DC | PRN

## 2018-02-05 MED ORDER — ACETAMINOPHEN 500 MG PO TABS: 500.0000 mg | ORAL_TABLET | Freq: Four times a day (QID) | ORAL | 0 refills | Status: DC | PRN
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
## 2018-02-05 NOTE — ED Notes (Signed)
Pt stated she had to leave and would not wait for discharge papers
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
## 2018-02-05 NOTE — ED Notes (Addendum)
Pt initially left, telling NT Kendal Hymen she would not wait for her d/c paperwork. Pt came back and allowed this writer to review d/c paperwork and prescriptions, but refuses all other interventions and tx. Vital signs stable, pt ambulatory upon departure.
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
## 2019-05-15 ENCOUNTER — Other Ambulatory Visit: Payer: Self-pay

## 2019-05-15 ENCOUNTER — Encounter (HOSPITAL_COMMUNITY): Payer: Self-pay | Admitting: Family Medicine

## 2019-05-15 ENCOUNTER — Ambulatory Visit (HOSPITAL_COMMUNITY)
Admission: EM | Admit: 2019-05-15 | Discharge: 2019-05-15 | Disposition: A | Discharge status: Home / Self Care | Payer: 59 | Attending: Family Medicine | Admitting: Family Medicine

## 2019-05-15 DIAGNOSIS — L739 Follicular disorder, unspecified: Secondary | ICD-10-CM

## 2019-05-15 MED ORDER — DOXYCYCLINE HYCLATE 100 MG PO TABS: 100.0000 mg | ORAL_TABLET | Freq: Two times a day (BID) | ORAL | 0 refills | Status: DC

## 2019-05-15 NOTE — Discharge Instructions (Addendum)
Wash the areas with soap and water at least twice a day

## 2019-05-15 NOTE — ED Provider Notes (Signed)
[place]    CSN: [id number] Arrival date & time: 05/15/19  1312     History   Chief Complaint Chief Complaint  Patient presents with  . Abscess    HPI Suzanne Goodwin is a 23 y.o. female.   Established 23 yo woman presents to [facility] for evaluation of abscesses on legs  Patient presents to Urgent Care with complaints of multiple abscesses on the backs of her upper legs since a week-4 days ago. Patient reports two of them are open and draining, one has not opened yet, pt states she gets ingrown hairs but they have never gotten this bad before.   Engineer, maintenance (IT).     Past Medical History:  Diagnosis Date  . Anxiety     Patient Active Problem List   Diagnosis Date Noted  . Abdominal pain 03/18/2016  . Anxiety 11/08/2014    Past Surgical History:  Procedure Laterality Date  . WISDOM TOOTH EXTRACTION      OB History    Gravida  2   Para  1   Term  1   Preterm  0   AB  1   Living  1     SAB  1   TAB  0   Ectopic  0   Multiple  0   Live Births  1            Home Medications    Prior to Admission medications   Medication Sig Start Date End Date Taking? Authorizing Provider  acetaminophen (TYLENOL) 500 MG tablet Take 1 tablet (500 mg total) by mouth every 6 (six) hours as needed. 02/05/18   Law, Bea Graff, PA-C  cetirizine (ZYRTEC) 10 MG tablet Take 10 mg by mouth daily.    [provider]  doxycycline (VIBRA-TABS) 100 MG tablet Take 1 tablet (100 mg total) by mouth 2 (two) times daily. 05/15/19   Robyn Haber, MD  ranitidine (ZANTAC) 150 MG capsule Take 1 capsule (150 mg total) by mouth 2 (two) times daily. Start taking if you start having abdominal pain again. 03/18/16   Milagros Loll, MD  sertraline (ZOLOFT) 25 MG tablet Take 1 tablet (25 mg total) by mouth daily. 03/18/16   Milagros Loll, MD    Family History Family History  Problem Relation Age of Onset  . Hypertension Mother   . Hypertension Father   .  Diabetes Paternal Grandmother   . Alzheimer's disease Paternal Grandmother   . Asthma Brother   . Cancer Maternal Grandfather        stomach  . Cancer Paternal Grandfather 5       breast  . Asthma Brother     Social History Social History   Tobacco Use  . Smoking status: Former Smoker    Packs/day: 1.00    Years: 3.00    Pack years: 3.00    Quit date: 02/04/2016    Years since quitting: 3.2  . Smokeless tobacco: Never Used  Substance Use Topics  . Alcohol use: No  . Drug use: Yes    Types: Marijuana     Allergies   Grapeseed extract [nutritional supplements]   Review of Systems Review of Systems   Physical Exam Triage Vital Signs ED Triage Vitals [05/15/19 1331]  Enc Vitals Group     BP      Pulse      Resp      Temp      Temp src  SpO2      Weight      Height      Head Circumference      Peak Flow      Pain Score 8     Pain Loc      Pain Edu?      Excl. in GC?    No data found.  Updated Vital Signs BP 116/60 (BP Location: Right Arm)   Pulse 71   Temp 98.4 F (36.9 C) (Oral)   Resp 17   SpO2 100%    Physical Exam Vitals signs and nursing note reviewed.  Constitutional:      Appearance: Normal appearance.  Eyes:     Conjunctiva/sclera: Conjunctivae normal.  Pulmonary:     Effort: Pulmonary effort is normal.  Musculoskeletal: Normal range of motion.  Skin:    General: Skin is warm.     Comments: Multiple 1-2 cm superficial ulcers on backs of legs.  Neurological:     General: No focal deficit present.     Mental Status: She is alert.  Psychiatric:        Mood and Affect: Mood normal.      UC Treatments / Results  Labs (all labs ordered are listed, but only abnormal results are displayed) Labs Reviewed - No data to display  EKG   Radiology No results found.  Procedures Procedures (including critical care time)  Medications Ordered in UC Medications - No data to display  Initial Impression / Assessment and Plan / UC  Course  I have reviewed the triage vital signs and the nursing notes.  Pertinent labs & imaging results that were available during my care of the patient were reviewed by me and considered in my medical decision making (see chart for details).     Final Clinical Impressions(s) / UC Diagnoses   Final diagnoses:  Folliculitis     Discharge Instructions     Wash the areas with soap and water at least twice a day    ED Prescriptions    Medication Sig Dispense Auth. Provider   doxycycline (VIBRA-TABS) 100 MG tablet Take 1 tablet (100 mg total) by mouth 2 (two) times daily. 20 tablet Elvina SidleLauenstein, Jadalyn Oliveri, MD     Controlled Substance Prescriptions [place] Controlled Substance Registry consulted? Not Applicable   Elvina SidleLauenstein, Jalecia Leon, MD 05/15/19 1349

## 2019-05-15 NOTE — ED Triage Notes (Signed)
Patient presents to Urgent Care with complaints of multiple abscesses on the backs of her upper legs since a week-4 days ago. Patient reports two of them are open and draining, one has not opened yet, pt states she gets ingrown hairs but they have never gotten this bad before.

## 2019-06-06 ENCOUNTER — Encounter (HOSPITAL_COMMUNITY): Payer: Self-pay | Admitting: Emergency Medicine

## 2019-06-06 ENCOUNTER — Other Ambulatory Visit: Payer: Self-pay

## 2019-06-06 ENCOUNTER — Ambulatory Visit (HOSPITAL_COMMUNITY)
Admission: EM | Admit: 2019-06-06 | Discharge: 2019-06-06 | Disposition: A | Discharge status: Home / Self Care | Payer: 59 | Attending: Emergency Medicine | Admitting: Emergency Medicine

## 2019-06-06 DIAGNOSIS — Z3202 Encounter for pregnancy test, result negative: Secondary | ICD-10-CM

## 2019-06-06 DIAGNOSIS — Z202 Contact with and (suspected) exposure to infections with a predominantly sexual mode of transmission: Secondary | ICD-10-CM | POA: Insufficient documentation

## 2019-06-06 LAB — POCT PREGNANCY, URINE: Preg Test, Ur: NEGATIVE

## 2019-06-06 MED ORDER — AZITHROMYCIN 250 MG PO TABS
1000.0000 mg | ORAL_TABLET | Freq: Once | ORAL | Status: AC
  Administered 2019-06-06: 1000 mg via ORAL

## 2019-06-06 MED ORDER — CEFTRIAXONE SODIUM 250 MG IJ SOLR
250.0000 mg | Freq: Once | INTRAMUSCULAR | Status: AC
  Administered 2019-06-06: 250 mg via INTRAMUSCULAR

## 2019-06-06 MED ORDER — AZITHROMYCIN 250 MG PO TABS
ORAL_TABLET | ORAL | Status: AC
  Filled 2019-06-06: qty 4

## 2019-06-06 MED ORDER — CEFTRIAXONE SODIUM 250 MG IJ SOLR
INTRAMUSCULAR | Status: AC
  Filled 2019-06-06: qty 250

## 2019-06-06 MED ORDER — METRONIDAZOLE 500 MG PO TABS: 500.0000 mg | ORAL_TABLET | Freq: Two times a day (BID) | ORAL | 0 refills | Status: AC

## 2019-06-06 NOTE — ED Triage Notes (Signed)
Pt here for exposure to GC/chlymidia and trichomonas,  denies sx

## 2019-06-06 NOTE — ED Provider Notes (Signed)
[place]    CSN: [id number] Arrival date & time: 06/06/19  1247      History   Chief Complaint Chief Complaint  Patient presents with  . Exposure to STD    HPI Suzanne Goodwin is a 23 y.o. female no significant past medical history presenting today for evaluation of STD exposure.  Patient states that her partner recently tested positive for gonorrhea, chlamydia and trichomonas and was recently treated.  She has not had any symptoms.  She denies vaginal discharge.  Denies itching or irritation.  Denies dysuria.  Denies pelvic pain or abdominal pain.  Denies fevers nausea or vomiting.  Last menstrual cycle was approximately 8/13.  Is not on any form of birth control.  HPI  Past Medical History:  Diagnosis Date  . Anxiety     Patient Active Problem List   Diagnosis Date Noted  . Abdominal pain 03/18/2016  . Anxiety 11/08/2014    Past Surgical History:  Procedure Laterality Date  . WISDOM TOOTH EXTRACTION      OB History    Gravida  2   Para  1   Term  1   Preterm  0   AB  1   Living  1     SAB  1   TAB  0   Ectopic  0   Multiple  0   Live Births  1            Home Medications    Prior to Admission medications   Medication Sig Start Date End Date Taking? Authorizing Provider  acetaminophen (TYLENOL) 500 MG tablet Take 1 tablet (500 mg total) by mouth every 6 (six) hours as needed. 02/05/18   Law, Bea Graff, PA-C  cetirizine (ZYRTEC) 10 MG tablet Take 10 mg by mouth daily.    [provider]  doxycycline (VIBRA-TABS) 100 MG tablet Take 1 tablet (100 mg total) by mouth 2 (two) times daily. Patient not taking: Reported on 06/06/2019 05/15/19   Robyn Haber, MD  metroNIDAZOLE (FLAGYL) 500 MG tablet Take 1 tablet (500 mg total) by mouth 2 (two) times daily for 7 days. 06/06/19 06/13/19  Wieters, Hallie C, PA-C  ranitidine (ZANTAC) 150 MG capsule Take 1 capsule (150 mg total) by mouth 2 (two) times daily. Start taking if you start  having abdominal pain again. 03/18/16   Milagros Loll, MD  sertraline (ZOLOFT) 25 MG tablet Take 1 tablet (25 mg total) by mouth daily. 03/18/16   Milagros Loll, MD    Family History Family History  Problem Relation Age of Onset  . Hypertension Mother   . Hypertension Father   . Diabetes Paternal Grandmother   . Alzheimer's disease Paternal Grandmother   . Asthma Brother   . Cancer Maternal Grandfather        stomach  . Cancer Paternal Grandfather 76       breast  . Asthma Brother     Social History Social History   Tobacco Use  . Smoking status: Former Smoker    Packs/day: 1.00    Years: 3.00    Pack years: 3.00    Quit date: 02/04/2016    Years since quitting: 3.3  . Smokeless tobacco: Never Used  Substance Use Topics  . Alcohol use: No  . Drug use: Yes    Types: Marijuana     Allergies   Grapeseed extract [nutritional supplements]   Review of Systems Review of Systems  Constitutional: Negative for fever.  Respiratory: Negative for shortness of breath.   Cardiovascular: Negative for chest pain.  Gastrointestinal: Negative for abdominal pain, diarrhea, nausea and vomiting.  Genitourinary: Negative for dysuria, flank pain, genital sores, hematuria, menstrual problem, vaginal bleeding, vaginal discharge and vaginal pain.  Musculoskeletal: Negative for back pain.  Skin: Negative for rash.  Neurological: Negative for dizziness, light-headedness and headaches.     Physical Exam Triage Vital Signs ED Triage Vitals  Enc Vitals Group     BP 06/06/19 1351 127/65     Pulse Rate 06/06/19 1351 70     Resp 06/06/19 1351 18     Temp 06/06/19 1351 98.4 F (36.9 C)     Temp Source 06/06/19 1351 Oral     SpO2 06/06/19 1351 98 %     Weight --      Height --      Head Circumference --      Peak Flow --      Pain Score 06/06/19 1352 0     Pain Loc --      Pain Edu? --      Excl. in GC? --    No data found.  Updated Vital Signs BP 127/65 (BP Location:  Right Arm)   Pulse 70   Temp 98.4 F (36.9 C) (Oral)   Resp 18   SpO2 98%   Visual Acuity Right Eye Distance:   Left Eye Distance:   Bilateral Distance:    Right Eye Near:   Left Eye Near:    Bilateral Near:     Physical Exam Vitals signs and nursing note reviewed.  Constitutional:      Appearance: She is well-developed.     Comments: No acute distress  HENT:     Head: Normocephalic and atraumatic.     Nose: Nose normal.  Eyes:     Conjunctiva/sclera: Conjunctivae normal.  Neck:     Musculoskeletal: Neck supple.  Cardiovascular:     Rate and Rhythm: Normal rate.  Pulmonary:     Effort: Pulmonary effort is normal. No respiratory distress.  Abdominal:     General: There is no distension.     Comments: Abdomen soft, nondistended, nontender to light palpation throughout entire abdomen.  Genitourinary:    Comments: Deferred Musculoskeletal: Normal range of motion.  Skin:    General: Skin is warm and dry.  Neurological:     Mental Status: She is alert and oriented to person, place, and time.      UC Treatments / Results  Labs (all labs ordered are listed, but only abnormal results are displayed) Labs Reviewed  POC URINE PREG, ED  POCT PREGNANCY, URINE  CERVICOVAGINAL ANCILLARY ONLY    EKG   Radiology No results found.  Procedures Procedures (including critical care time)  Medications Ordered in UC Medications  cefTRIAXone (ROCEPHIN) injection 250 mg (250 mg Intramuscular Given 06/06/19 1431)  azithromycin (ZITHROMAX) tablet 1,000 mg (1,000 mg Oral Given 06/06/19 1432)  azithromycin (ZITHROMAX) 250 MG tablet (has no administration in time range)  cefTRIAXone (ROCEPHIN) 250 MG injection (has no administration in time range)    Initial Impression / Assessment and Plan / UC Course  I have reviewed the triage vital signs and the nursing notes.  Pertinent labs & imaging results that were available during my care of the patient were reviewed by me and  considered in my medical decision making (see chart for details).     Patient with exposure to gonorrhea, chlamydia and trichomonas.  Will empirically treat  for these today.  Swab obtained, pregnancy test negative.  Providing Rocephin and azithromycin prior to discharge.  Continue with Flagyl twice daily x1 week with food.  Will call with results and provide further treatment if needed.Discussed strict return precautions. Patient verbalized understanding and is agreeable with plan.  Final Clinical Impressions(s) / UC Diagnoses   Final diagnoses:  STD exposure     Discharge Instructions     We have treated you today for gonorrhea and chlamydia, with rocephin and azithromycin. Please refrain from sexual intercourse for 7 days while medicines eliminating infection.  Begin metronidazole twice daily for 1 week to treat for trichomonas.  Take with food.  Do not drink alcohol until finishing course.  We are testing you for Gonorrhea, Chlamydia, Trichomonas, Yeast and Bacterial Vaginosis. We will call you if anything is positive and let you know if you require any further treatment. Please inform partners of any positive results.   Please return if symptoms not improving with treatment, development of fever, nausea, vomiting, abdominal pain.     ED Prescriptions    Medication Sig Dispense Auth. Provider   metroNIDAZOLE (FLAGYL) 500 MG tablet Take 1 tablet (500 mg total) by mouth 2 (two) times daily for 7 days. 14 tablet Wieters, [place]. [place] C, PA-C     Controlled Substance Prescriptions [place] Controlled Substance Registry consulted? Not Applicable   Lew DawesWieters, Hallie C, [place]PA-C 06/06/19 1513

## 2019-06-06 NOTE — Discharge Instructions (Addendum)
We have treated you today for gonorrhea and chlamydia, with rocephin and azithromycin. Please refrain from sexual intercourse for 7 days while medicines eliminating infection.  Begin metronidazole twice daily for 1 week to treat for trichomonas.  Take with food.  Do not drink alcohol until finishing course.  We are testing you for Gonorrhea, Chlamydia, Trichomonas, Yeast and Bacterial Vaginosis. We will call you if anything is positive and let you know if you require any further treatment. Please inform partners of any positive results.   Please return if symptoms not improving with treatment, development of fever, nausea, vomiting, abdominal pain.

## 2019-06-08 LAB — CERVICOVAGINAL ANCILLARY ONLY
Chlamydia: NEGATIVE
Neisseria Gonorrhea: NEGATIVE
Trichomonas: NEGATIVE

## 2019-09-08 ENCOUNTER — Inpatient Hospital Stay (HOSPITAL_COMMUNITY): Payer: 59

## 2019-09-08 ENCOUNTER — Inpatient Hospital Stay (HOSPITAL_COMMUNITY)
Admission: AD | Admit: 2019-09-08 | Discharge: 2019-09-08 | Disposition: A | Discharge status: Home / Self Care | Payer: 59 | Attending: Obstetrics & Gynecology | Admitting: Obstetrics & Gynecology

## 2019-09-08 ENCOUNTER — Other Ambulatory Visit: Payer: Self-pay

## 2019-09-08 ENCOUNTER — Encounter (HOSPITAL_COMMUNITY): Payer: Self-pay | Admitting: *Deleted

## 2019-09-08 DIAGNOSIS — Z87891 Personal history of nicotine dependence: Secondary | ICD-10-CM | POA: Insufficient documentation

## 2019-09-08 DIAGNOSIS — O26891 Other specified pregnancy related conditions, first trimester: Secondary | ICD-10-CM | POA: Insufficient documentation

## 2019-09-08 DIAGNOSIS — Z3A01 Less than 8 weeks gestation of pregnancy: Secondary | ICD-10-CM | POA: Insufficient documentation

## 2019-09-08 DIAGNOSIS — R109 Unspecified abdominal pain: Secondary | ICD-10-CM | POA: Diagnosis not present

## 2019-09-08 DIAGNOSIS — O26899 Other specified pregnancy related conditions, unspecified trimester: Secondary | ICD-10-CM

## 2019-09-08 DIAGNOSIS — Z3490 Encounter for supervision of normal pregnancy, unspecified, unspecified trimester: Secondary | ICD-10-CM

## 2019-09-08 LAB — POCT PREGNANCY, URINE: Preg Test, Ur: POSITIVE — AB

## 2019-09-08 LAB — URINALYSIS, ROUTINE W REFLEX MICROSCOPIC
Bilirubin Urine: NEGATIVE
Glucose, UA: NEGATIVE mg/dL
Hgb urine dipstick: NEGATIVE
Ketones, ur: NEGATIVE mg/dL
Nitrite: NEGATIVE
Protein, ur: NEGATIVE mg/dL
Specific Gravity, Urine: 1.004 — ABNORMAL LOW (ref 1.005–1.030)
pH: 5 (ref 5.0–8.0)

## 2019-09-08 LAB — WET PREP, GENITAL
Clue Cells Wet Prep HPF POC: NONE SEEN
Sperm: NONE SEEN
Trich, Wet Prep: NONE SEEN
Yeast Wet Prep HPF POC: NONE SEEN

## 2019-09-08 MED ORDER — FLINTSTONES GUMMIES COMPLETE PO CHEW: 1.0000 | CHEWABLE_TABLET | Freq: Every day | ORAL | 3 refills | Status: DC

## 2019-09-08 NOTE — MAU Provider Note (Signed)
History     CSN: [id number]  Arrival date and time: 09/08/19 1954   First Provider Initiated Contact with Patient 09/08/19 2031      Chief Complaint  Patient presents with  . Vaginal Discharge   HPI Suzanne Goodwin is a 23 y.o. G3P1011 at Unknown GA in early pregnancy who presents to MAU with chief complaint of abdominal pain in the setting of positive home pregnancy test. Her pain is generalized to her lower abdomen, rated as 6/10, and waxes and wanes since onset two days ago. She denies pain on CNM initial assessment. She denies aggravating or alleviating factors. She has not taken medication or tried other treatments for this complaint.   Most recent intercourse last night. Denies vaginal bleeding, abdominal tenderness, dysuria, fever or recent illness.  Patient is planning to terminate her pregnancy and asks CNM to perform this procedure in MAU.  OB History    Gravida  3   Para  1   Term  1   Preterm  0   AB  1   Living  1     SAB  1   TAB  0   Ectopic  0   Multiple  0   Live Births  1           Past Medical History:  Diagnosis Date  . Anxiety     Past Surgical History:  Procedure Laterality Date  . WISDOM TOOTH EXTRACTION      Family History  Problem Relation Age of Onset  . Hypertension Mother   . Hypertension Father   . Diabetes Paternal Grandmother   . Alzheimer's disease Paternal Grandmother   . Asthma Brother   . Cancer Maternal Grandfather        stomach  . Cancer Paternal Grandfather [id number]       breast  . Asthma Brother     Social History   Tobacco Use  . Smoking status: Former Smoker    Packs/day: 1.00    Years: 3.00    Pack years: 3.00    Quit date: 02/04/2016    Years since quitting: 3.5  . Smokeless tobacco: Never Used  Substance Use Topics  . Alcohol use: No  . Drug use: Yes    Types: Marijuana    Allergies:  Allergies  Allergen Reactions  . Grapeseed Extract [Nutritional Supplements]     muscadine     Medications Prior to Admission  Medication Sig Dispense Refill Last Dose  . cetirizine (ZYRTEC) 10 MG tablet Take 10 mg by mouth daily.   09/08/2019 at Unknown time  . acetaminophen (TYLENOL) 500 MG tablet Take 1 tablet (500 mg total) by mouth every 6 (six) hours as needed. 30 tablet 0 Unknown at Unknown time  . doxycycline (VIBRA-TABS) 100 MG tablet Take 1 tablet (100 mg total) by mouth 2 (two) times daily. (Patient not taking: Reported on 06/06/2019) 20 tablet 0   . ranitidine (ZANTAC) 150 MG capsule Take 1 capsule (150 mg total) by mouth 2 (two) times daily. Start taking if you start having abdominal pain again. 60 capsule 0   . sertraline (ZOLOFT) 25 MG tablet Take 1 tablet (25 mg total) by mouth daily. 30 tablet 1     Review of Systems  Respiratory: Negative for shortness of breath.   Gastrointestinal: Positive for abdominal pain.  Genitourinary: Negative for difficulty urinating, dysuria and vaginal bleeding.  Musculoskeletal: Negative for back pain.  All other systems reviewed and are negative.  Physical Exam  Blood pressure 122/75, pulse 73, temperature 98.5 F (36.9 C), resp. rate 16.  Physical Exam  Nursing note and vitals reviewed. Constitutional: She is oriented to person, place, and time. She appears well-developed and well-nourished.  Cardiovascular: Normal rate.  Respiratory: Effort normal and breath sounds normal.  GI: Soft. There is no abdominal tenderness. There is no CVA tenderness.  Neurological: She is alert and oriented to person, place, and time.  Skin: Skin is warm and dry.  Psychiatric: She has a normal mood and affect. Her behavior is normal. Judgment and thought content normal.    MAU Course/MDM  Procedures  Patient Vitals for the past 24 hrs:  BP Temp Pulse Resp  09/08/19 2136 115/65 - 68 16  09/08/19 2010 122/75 - 73 -  09/08/19 2009 - 98.5 F (36.9 C) - 16   Results for orders placed or performed during the hospital encounter of 09/08/19  (from the past 24 hour(s))  Urinalysis, Routine w reflex microscopic     Status: Abnormal   Collection Time: 09/08/19  8:26 PM  Result Value Ref Range   Color, Urine STRAW (A) YELLOW   APPearance CLEAR CLEAR   Specific Gravity, Urine 1.004 (L) 1.005 - 1.030   pH 5.0 5.0 - 8.0   Glucose, UA NEGATIVE NEGATIVE mg/dL   Hgb urine dipstick NEGATIVE NEGATIVE   Bilirubin Urine NEGATIVE NEGATIVE   Ketones, ur NEGATIVE NEGATIVE mg/dL   Protein, ur NEGATIVE NEGATIVE mg/dL   Nitrite NEGATIVE NEGATIVE   Leukocytes,Ua TRACE (A) NEGATIVE   RBC / HPF 0-5 0 - 5 RBC/hpf   WBC, UA 0-5 0 - 5 WBC/hpf   Bacteria, UA RARE (A) NONE SEEN   Squamous Epithelial / LPF 0-5 0 - 5  Pregnancy, urine POC     Status: Abnormal   Collection Time: 09/08/19  8:29 PM  Result Value Ref Range   Preg Test, Ur POSITIVE (A) NEGATIVE   US Ob Less Than 14 Weeks With Ob Transvaginal  Result Date: 09/08/2019 CLINICAL DATA:  23 year old pregnant female with pelvic cramping. Unknown LMP. EXAM: OBSTETRIC <14 WK [place] AND TRANSVAGINAL OB US TECHNIQUE: Both transabdominal and transvaginal ultrasound examinations were performed for complete evaluation of the gestation as well as the maternal uterus, adnexal regions, and pelvic cul-de-sac. Transvaginal technique was performed to assess early pregnancy. COMPARISON:  None. FINDINGS: Intrauterine gestational sac: Single intrauterine gestational sac. Yolk sac:  Seen Embryo:  Not present Cardiac Activity: N/A MSD: 12.5 mm   6 w   0 d Subchorionic hemorrhage:  None visualized. Maternal uterus/adnexae: The maternal ovaries are unremarkable. IMPRESSION: Single intrauterine gestational sac with an estimated gestational age of [redacted] weeks, 0 days. No fetal pole identified at this time. Follow-up with ultrasound in 7-11 days, or earlier if clinically indicated, recommended. Electronically Signed   By: Anner Crete M.D.   On: 09/08/2019 21:22   Meds ordered this encounter  Medications  . Pediatric  Multivit-Minerals-C (FLINTSTONES GUMMIES COMPLETE) CHEW    Sig: Chew 1 each by mouth daily.    Dispense:  30 tablet    Refill:  3    Order Specific Question:   Supervising Provider    Answer:   Emily Filbert [[id number]]    Assessment and Plan  --23 y.o. [phone] with confirmed IUP --Blood work declined by patient --Follow-up ultrasound declined by patient --Discharge in stable condition, return to MAU for pregnancy-related complaints or concerns.  Patient requests discharge prior to results of wet prep. Confirmed I will call  with results tomorrow morning.  Calvert Cantor, CNM 09/08/2019, 9:54 PM

## 2019-09-08 NOTE — Discharge Instructions (Signed)
First Trimester of Pregnancy ° °The first trimester of pregnancy is from week 1 until the end of week 13 (months 1 through 3). During this time, your baby will begin to develop inside you. At 6-8 weeks, the eyes and face are formed, and the heartbeat can be seen on ultrasound. At the end of 12 weeks, all the baby's organs are formed. Prenatal care is all the medical care you receive before the birth of your baby. Make sure you get good prenatal care and follow all of your doctor's instructions. °Follow these instructions at home: °Medicines °· Take over-the-counter and prescription medicines only as told by your doctor. Some medicines are safe and some medicines are not safe during pregnancy. °· Take a prenatal vitamin that contains at least 600 micrograms (mcg) of folic acid. °· If you have trouble pooping (constipation), take medicine that will make your stool soft (stool softener) if your doctor approves. °Eating and drinking ° °· Eat regular, healthy meals. °· Your doctor will tell you the amount of weight gain that is right for you. °· Avoid raw meat and uncooked cheese. °· If you feel sick to your stomach (nauseous) or throw up (vomit): °? Eat 4 or 5 small meals a day instead of 3 large meals. °? Try eating a few soda crackers. °? Drink liquids between meals instead of during meals. °· To prevent constipation: °? Eat foods that are high in fiber, like fresh fruits and vegetables, whole grains, and beans. °? Drink enough fluids to keep your pee (urine) clear or pale yellow. °Activity °· Exercise only as told by your doctor. Stop exercising if you have cramps or pain in your lower belly (abdomen) or low back. °· Do not exercise if it is too hot, too humid, or if you are in a place of great height (high altitude). °· Try to avoid standing for long periods of time. Move your legs often if you must stand in one place for a long time. °· Avoid heavy lifting. °· Wear low-heeled shoes. Sit and stand up  straight. °· You can have sex unless your doctor tells you not to. °Relieving pain and discomfort °· Wear a good support bra if your breasts are sore. °· Take warm water baths (sitz baths) to soothe pain or discomfort caused by hemorrhoids. Use hemorrhoid cream if your doctor says it is okay. °· Rest with your legs raised if you have leg cramps or low back pain. °· If you have puffy, bulging veins (varicose veins) in your legs: °? Wear support hose or compression stockings as told by your doctor. °? Raise (elevate) your feet for 15 minutes, 3-4 times a day. °? Limit salt in your food. °Prenatal care °· Schedule your prenatal visits by the twelfth week of pregnancy. °· Write down your questions. Take them to your prenatal visits. °· Keep all your prenatal visits as told by your doctor. This is important. °Safety °· Wear your seat belt at all times when driving. °· Make a list of emergency phone numbers. The list should include numbers for family, friends, the hospital, and police and fire departments. °General instructions °· Ask your doctor for a referral to a local prenatal class. Begin classes no later than at the start of month 6 of your pregnancy. °· Ask for help if you need counseling or if you need help with nutrition. Your doctor can give you advice or tell you where to go for help. °· Do not use hot tubs, steam   rooms, or saunas. °· Do not douche or use tampons or scented sanitary pads. °· Do not cross your legs for long periods of time. °· Avoid all herbs and alcohol. Avoid drugs that are not approved by your doctor. °· Do not use any tobacco products, including cigarettes, chewing tobacco, and electronic cigarettes. If you need help quitting, ask your doctor. You may get counseling or other support to help you quit. °· Avoid cat litter boxes and soil used by cats. These carry germs that can cause birth defects in the baby and can cause a loss of your baby (miscarriage) or stillbirth. °· Visit your dentist.  At home, brush your teeth with a soft toothbrush. Be gentle when you floss. °Contact a doctor if: °· You are dizzy. °· You have mild cramps or pressure in your lower belly. °· You have a nagging pain in your belly area. °· You continue to feel sick to your stomach, you throw up, or you have watery poop (diarrhea). °· You have a bad smelling fluid coming from your vagina. °· You have pain when you pee (urinate). °· You have increased puffiness (swelling) in your face, hands, legs, or ankles. °Get help right away if: °· You have a fever. °· You are leaking fluid from your vagina. °· You have spotting or bleeding from your vagina. °· You have very bad belly cramping or pain. °· You gain or lose weight rapidly. °· You throw up blood. It may look like coffee grounds. °· You are around people who have German measles, fifth disease, or chickenpox. °· You have a very bad headache. °· You have shortness of breath. °· You have any kind of trauma, such as from a fall or a car accident. °Summary °· The first trimester of pregnancy is from week 1 until the end of week 13 (months 1 through 3). °· To take care of yourself and your unborn baby, you will need to eat healthy meals, take medicines only if your doctor tells you to do so, and do activities that are safe for you and your baby. °· Keep all follow-up visits as told by your doctor. This is important as your doctor will have to ensure that your baby is healthy and growing well. °This information is not intended to replace advice given to you by your health care provider. Make sure you discuss any questions you have with your health care provider. °Document Released: 03/10/2008 Document Revised: 01/13/2019 Document Reviewed: 09/30/2016 °Elsevier Patient Education © 2020 [employer] ° °

## 2019-09-08 NOTE — MAU Note (Signed)
Having vag d/c and abd cramps for couple days. LMP unknown. Had positive upt few days ago. White d/c without odor. Denies vag bleeding

## 2019-09-08 NOTE — Progress Notes (Signed)
Maryelizabeth Kaufmann CNM in to discuss test results and d/c plan. Written and verbal d/c instructions given and understanding voiced

## 2019-09-12 LAB — GC/CHLAMYDIA PROBE AMP (~~LOC~~) NOT AT ARMC
Chlamydia: NEGATIVE
Comment: NEGATIVE
Comment: NORMAL
Neisseria Gonorrhea: NEGATIVE

## 2020-01-01 ENCOUNTER — Other Ambulatory Visit: Payer: Self-pay

## 2020-01-01 ENCOUNTER — Emergency Department (HOSPITAL_COMMUNITY)
Admission: EM | Admit: 2020-01-01 | Discharge: 2020-01-01 | Disposition: A | Discharge status: Home / Self Care | Payer: 59 | Attending: Emergency Medicine | Admitting: Emergency Medicine

## 2020-01-01 ENCOUNTER — Encounter (HOSPITAL_COMMUNITY): Payer: Self-pay | Admitting: Obstetrics and Gynecology

## 2020-01-01 DIAGNOSIS — Y999 Unspecified external cause status: Secondary | ICD-10-CM | POA: Diagnosis not present

## 2020-01-01 DIAGNOSIS — S161XXA Strain of muscle, fascia and tendon at neck level, initial encounter: Secondary | ICD-10-CM | POA: Diagnosis not present

## 2020-01-01 DIAGNOSIS — Z87891 Personal history of nicotine dependence: Secondary | ICD-10-CM | POA: Insufficient documentation

## 2020-01-01 DIAGNOSIS — Y92410 Unspecified street and highway as the place of occurrence of the external cause: Secondary | ICD-10-CM | POA: Insufficient documentation

## 2020-01-01 DIAGNOSIS — Y9389 Activity, other specified: Secondary | ICD-10-CM | POA: Diagnosis not present

## 2020-01-01 DIAGNOSIS — S199XXA Unspecified injury of neck, initial encounter: Secondary | ICD-10-CM | POA: Diagnosis present

## 2020-01-01 DIAGNOSIS — Z79899 Other long term (current) drug therapy: Secondary | ICD-10-CM | POA: Insufficient documentation

## 2020-01-01 MED ORDER — IBUPROFEN 600 MG PO TABS: 600.0000 mg | ORAL_TABLET | Freq: Four times a day (QID) | ORAL | 0 refills | Status: DC | PRN

## 2020-01-01 MED ORDER — CYCLOBENZAPRINE HCL 10 MG PO TABS: 10.0000 mg | ORAL_TABLET | Freq: Two times a day (BID) | ORAL | 0 refills | Status: DC | PRN
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
## 2020-02-23 IMAGING — US US OB < 14 WEEKS - US OB TV
1 series · 15 of 28 positions shown · non-contrast
Comparison: None.

CLINICAL DATA: 23-year-old pregnant female with pelvic cramping.
Unknown LMP.

EXAM:
OBSTETRIC <14 WK US AND TRANSVAGINAL OB US
TECHNIQUE: Both transabdominal and transvaginal ultrasound examinations were
performed for complete evaluation of the gestation as well as the
maternal uterus, adnexal regions, and pelvic cul-de-sac.
Transvaginal technique was performed to assess early pregnancy.

[Series 1: us ob < 14 weeks - us ob tv · 15 of 69 slices shown]
[im 1/69]
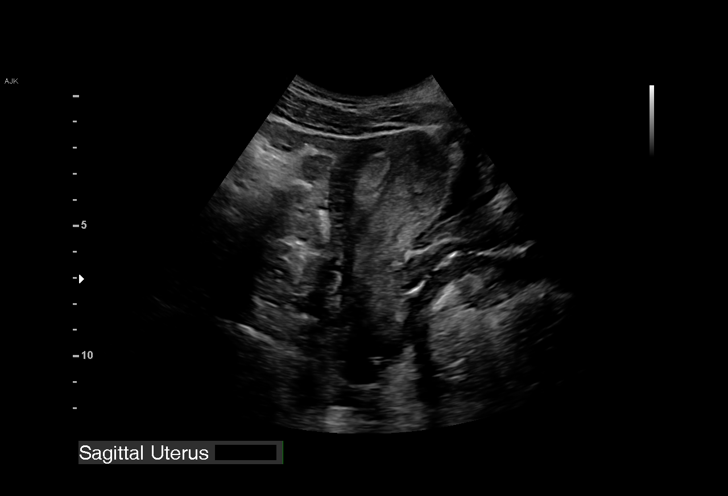
[im 6/69]
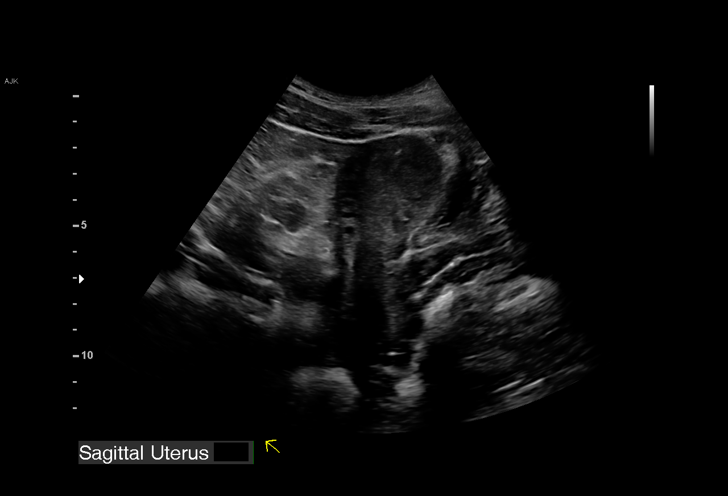
[im 11/69]
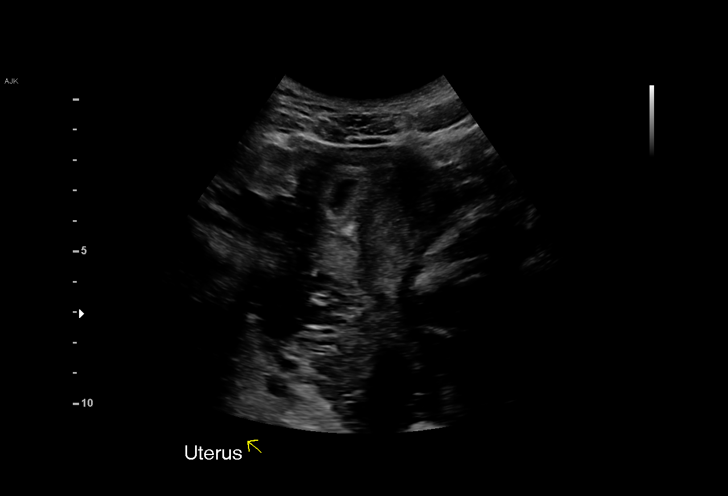
[im 16/69]
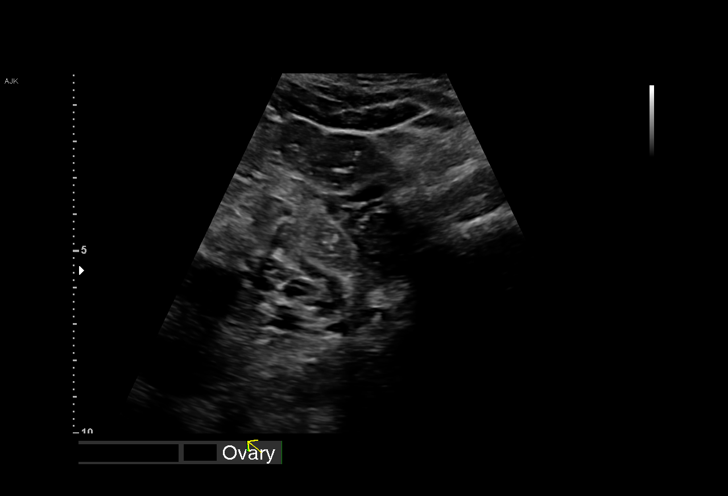
[im 21/69]
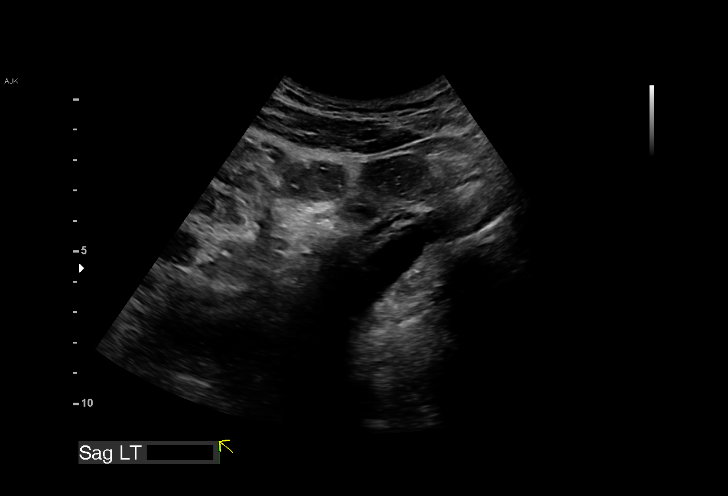
[im 26/69]
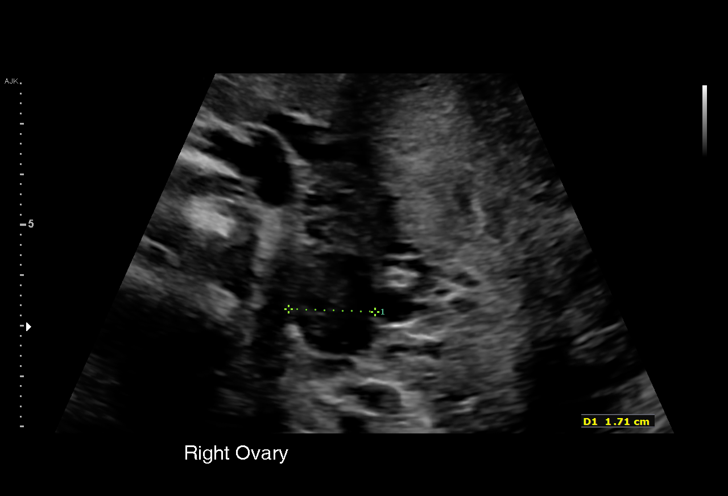
[im 31/69]
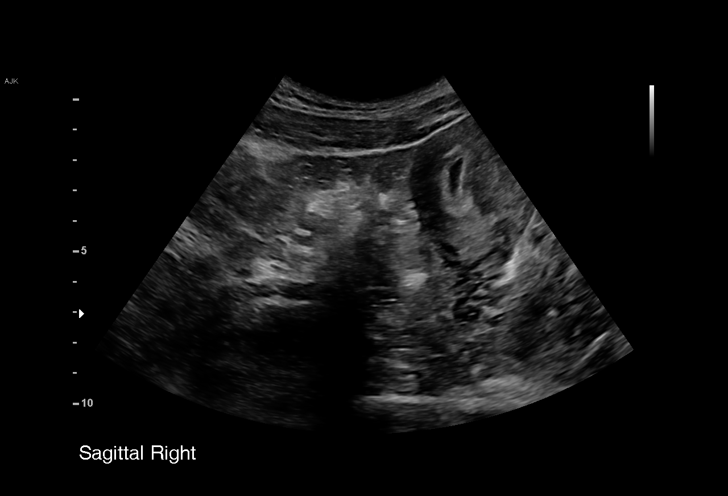
[im 36/69]
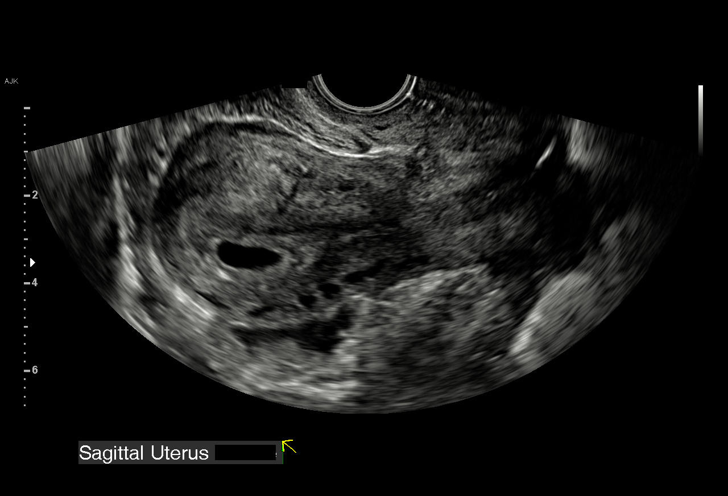
[im 38/69]
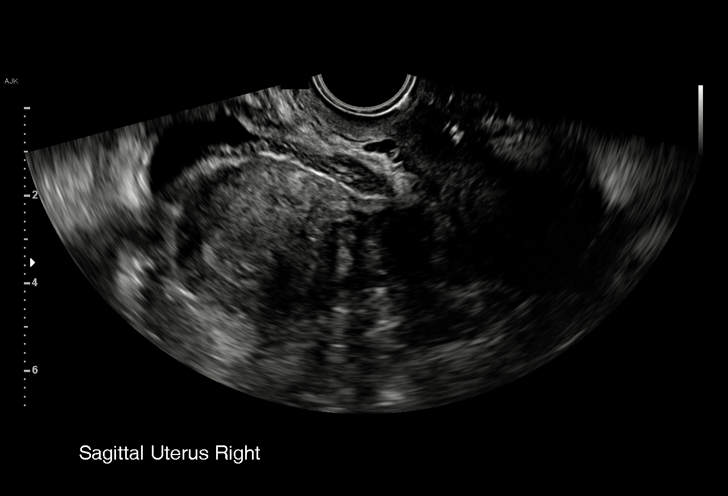
[im 43/69]
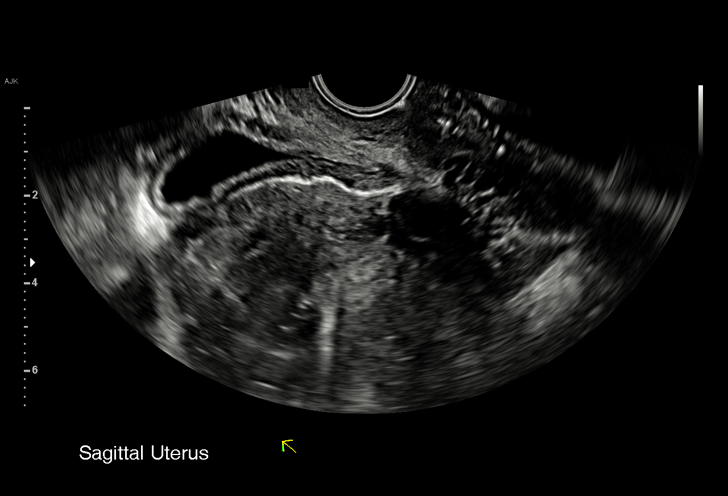
[im 48/69]
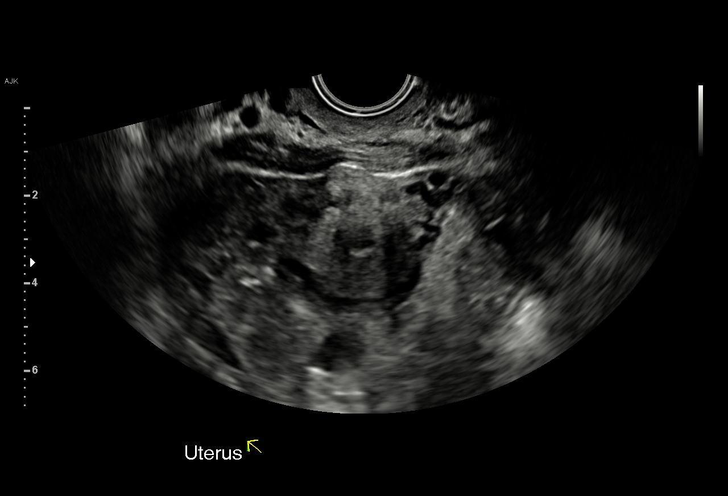
[im 53/69]
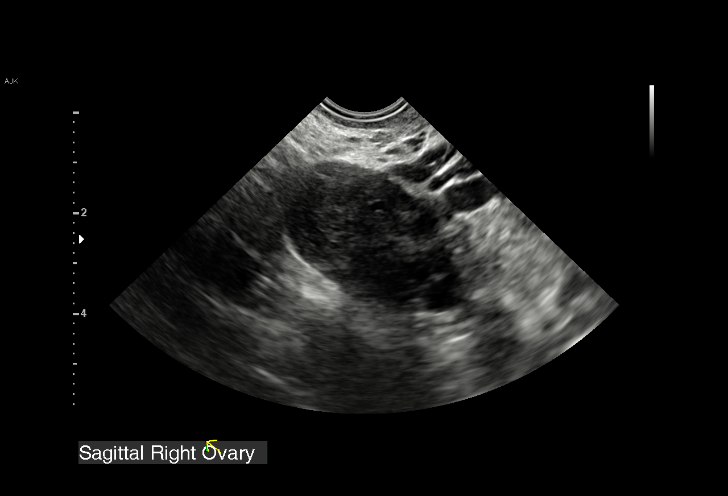
[im 58/69]
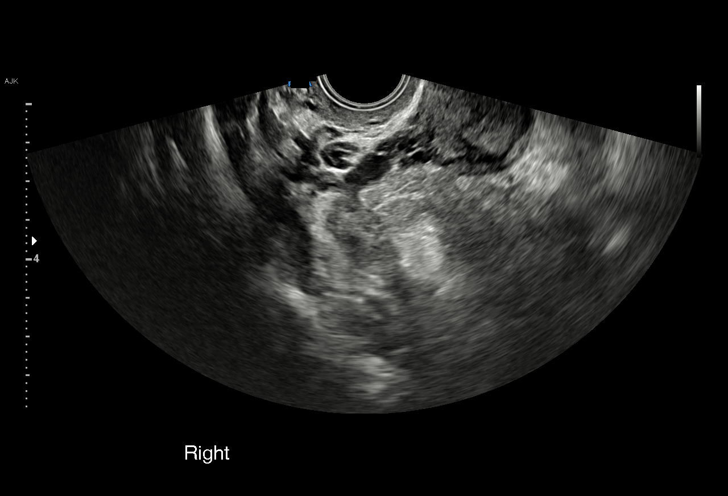
[im 63/69]
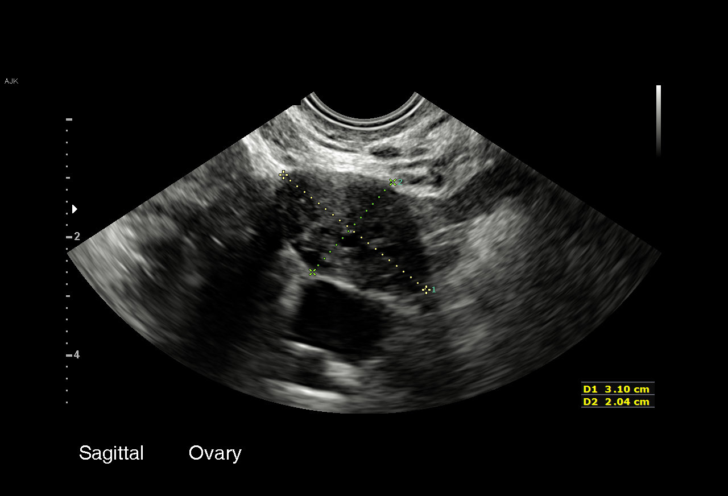
[im 69/69]
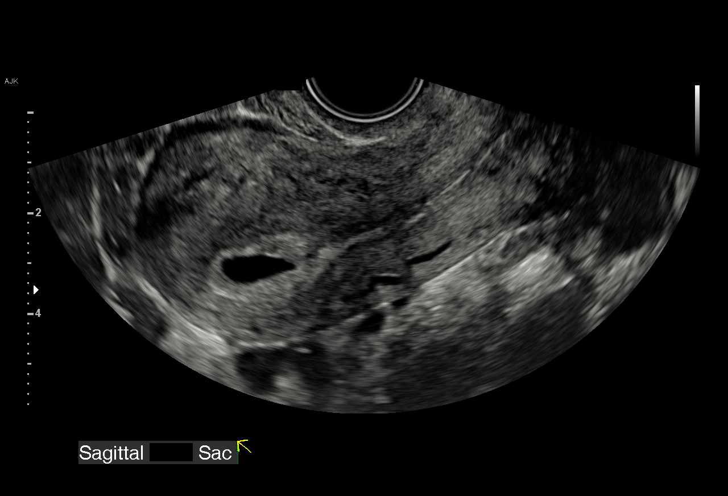

[15 of 28 positions shown; findings below may reference images not displayed]

FINDINGS: Intrauterine gestational sac: Single intrauterine gestational sac.

Yolk sac:  Seen

Embryo:  Not present

Cardiac Activity: N/A

MSD: 12.5 mm   6 w   0 d

Subchorionic hemorrhage:  None visualized.

Maternal uterus/adnexae: The maternal ovaries are unremarkable.
IMPRESSION: Single intrauterine gestational sac with an estimated gestational
age of 6 weeks, 0 days. No fetal pole identified at this time.
Follow-up with ultrasound in 7-11 days, or earlier if clinically
indicated, recommended.

## 2020-11-16 ENCOUNTER — Ambulatory Visit (HOSPITAL_COMMUNITY)
Admission: EM | Admit: 2020-11-16 | Discharge: 2020-11-17 | Disposition: A | Discharge status: Home / Self Care | Payer: 59

## 2020-11-16 ENCOUNTER — Encounter (HOSPITAL_COMMUNITY): Payer: Self-pay | Admitting: Behavioral Health

## 2020-11-16 ENCOUNTER — Other Ambulatory Visit: Payer: Self-pay

## 2020-11-16 DIAGNOSIS — F332 Major depressive disorder, recurrent severe without psychotic features: Secondary | ICD-10-CM | POA: Diagnosis not present

## 2020-11-16 DIAGNOSIS — F4310 Post-traumatic stress disorder, unspecified: Secondary | ICD-10-CM | POA: Diagnosis not present
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
## 2020-11-16 NOTE — ED Triage Notes (Signed)
Pt presents as walk-in to [facility] accompanied by her dad. Pt A&O x4, calm and cooperative. Pt asked dad to step out of room during triage. Pt states she's been "going through depression, can't control anxiety, feels nervous a lot, and can't remember a lot." Pt denies current SI but reports SI 2 days ago and states she cut herself on her arm and leg. Pt denies HI and AVH.

## 2020-11-16 NOTE — ED Notes (Signed)
PATIENT BELONGINGS ARE STORED IN LOCKER#13
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
## 2020-11-17 NOTE — Discharge Instructions (Addendum)
  Discharge recommendations:  Patient is to take medications as prescribed. Please see information for follow-up appointment with psychiatry and therapy. Please follow up with your primary care provider for all medical related needs.   Therapy: We recommend that patient participate in individual therapy to address mental health concerns.  Medications: The patient is to contact a medical professional and/or outpatient provider to address any new side effects that develop. Patient should update outpatient providers of any new medications and/or medication changes.   Safety:  The patient should abstain from use of illicit substances/drugs and abuse of any medications. If symptoms worsen or do not continue to improve or if the patient becomes actively suicidal or homicidal then it is recommended that the patient return to the closest hospital emergency department, the [facility], or call 911 for further evaluation and treatment. National Suicide Prevention Lifeline 1-800-SUICIDE or 1-800-273-8255.  

## 2020-11-17 NOTE — ED Notes (Signed)
Pt verbalized understanding of discharge instructions.  Left [facility] A&O x 4, accompanied by family.  No distress noted.

## 2023-05-10 ENCOUNTER — Emergency Department (HOSPITAL_COMMUNITY): Payer: Medicaid Other | Admitting: Anesthesiology

## 2023-05-10 ENCOUNTER — Encounter (HOSPITAL_COMMUNITY): Admission: EM | Disposition: A | Discharge status: Home / Self Care | Payer: Self-pay | Source: Home / Self Care

## 2023-05-10 ENCOUNTER — Emergency Department (HOSPITAL_COMMUNITY): Payer: Medicaid Other

## 2023-05-10 ENCOUNTER — Inpatient Hospital Stay (HOSPITAL_COMMUNITY)
Admission: EM | Admit: 2023-05-10 | Discharge: 2023-05-14 | DRG: 205 | Disposition: A | Discharge status: Home / Self Care | Payer: Medicaid Other | Attending: General Surgery | Admitting: General Surgery

## 2023-05-10 ENCOUNTER — Encounter (HOSPITAL_COMMUNITY): Payer: Self-pay | Admitting: Anesthesiology

## 2023-05-10 DIAGNOSIS — W3400XA Accidental discharge from unspecified firearms or gun, initial encounter: Secondary | ICD-10-CM

## 2023-05-10 DIAGNOSIS — Z825 Family history of asthma and other chronic lower respiratory diseases: Secondary | ICD-10-CM

## 2023-05-10 DIAGNOSIS — S21332A Puncture wound without foreign body of left front wall of thorax with penetration into thoracic cavity, initial encounter: Secondary | ICD-10-CM | POA: Diagnosis present

## 2023-05-10 DIAGNOSIS — R109 Unspecified abdominal pain: Secondary | ICD-10-CM | POA: Diagnosis present

## 2023-05-10 DIAGNOSIS — Z820 Family history of epilepsy and other diseases of the nervous system: Secondary | ICD-10-CM

## 2023-05-10 DIAGNOSIS — F419 Anxiety disorder, unspecified: Secondary | ICD-10-CM | POA: Diagnosis present

## 2023-05-10 DIAGNOSIS — R110 Nausea: Secondary | ICD-10-CM | POA: Diagnosis not present

## 2023-05-10 DIAGNOSIS — Z23 Encounter for immunization: Secondary | ICD-10-CM

## 2023-05-10 DIAGNOSIS — D62 Acute posthemorrhagic anemia: Secondary | ICD-10-CM | POA: Diagnosis present

## 2023-05-10 DIAGNOSIS — S27321A Contusion of lung, unilateral, initial encounter: Principal | ICD-10-CM | POA: Diagnosis present

## 2023-05-10 DIAGNOSIS — Z833 Family history of diabetes mellitus: Secondary | ICD-10-CM

## 2023-05-10 DIAGNOSIS — Z87891 Personal history of nicotine dependence: Secondary | ICD-10-CM

## 2023-05-10 DIAGNOSIS — T797XXA Traumatic subcutaneous emphysema, initial encounter: Secondary | ICD-10-CM | POA: Diagnosis present

## 2023-05-10 DIAGNOSIS — Z9109 Other allergy status, other than to drugs and biological substances: Secondary | ICD-10-CM

## 2023-05-10 DIAGNOSIS — T1490XA Injury, unspecified, initial encounter: Principal | ICD-10-CM

## 2023-05-10 DIAGNOSIS — I959 Hypotension, unspecified: Secondary | ICD-10-CM | POA: Diagnosis present

## 2023-05-10 DIAGNOSIS — J939 Pneumothorax, unspecified: Secondary | ICD-10-CM

## 2023-05-10 DIAGNOSIS — S272XXA Traumatic hemopneumothorax, initial encounter: Secondary | ICD-10-CM | POA: Diagnosis present

## 2023-05-10 DIAGNOSIS — R0602 Shortness of breath: Secondary | ICD-10-CM | POA: Diagnosis present

## 2023-05-10 DIAGNOSIS — Z8249 Family history of ischemic heart disease and other diseases of the circulatory system: Secondary | ICD-10-CM

## 2023-05-10 DIAGNOSIS — J90 Pleural effusion, not elsewhere classified: Secondary | ICD-10-CM | POA: Diagnosis present

## 2023-05-10 DIAGNOSIS — E876 Hypokalemia: Secondary | ICD-10-CM | POA: Diagnosis present

## 2023-05-10 DIAGNOSIS — M549 Dorsalgia, unspecified: Secondary | ICD-10-CM | POA: Diagnosis present

## 2023-05-10 DIAGNOSIS — N179 Acute kidney failure, unspecified: Secondary | ICD-10-CM | POA: Diagnosis present

## 2023-05-10 DIAGNOSIS — S2242XA Multiple fractures of ribs, left side, initial encounter for closed fracture: Secondary | ICD-10-CM | POA: Diagnosis present

## 2023-05-10 LAB — COMPREHENSIVE METABOLIC PANEL
ALT: 14 U/L (ref 0–44)
AST: 26 U/L (ref 15–41)
Albumin: 3.7 g/dL (ref 3.5–5.0)
Alkaline Phosphatase: 75 U/L (ref 38–126)
Anion gap: 14 (ref 5–15)
BUN: 13 mg/dL (ref 6–20)
CO2: 20 mmol/L — ABNORMAL LOW (ref 22–32)
Calcium: 8.9 mg/dL (ref 8.9–10.3)
Chloride: 106 mmol/L (ref 98–111)
Creatinine, Ser: 1.29 mg/dL — ABNORMAL HIGH (ref 0.44–1.00)
GFR, Estimated: 58 mL/min — ABNORMAL LOW (ref >60)
Glucose, Bld: 87 mg/dL (ref 70–99)
Potassium: 2.9 mmol/L — ABNORMAL LOW (ref 3.5–5.1)
Sodium: 140 mmol/L (ref 135–145)
Total Bilirubin: 0.5 mg/dL (ref 0.3–1.2)
Total Protein: 6.3 g/dL — ABNORMAL LOW (ref 6.5–8.1)

## 2023-05-10 LAB — CBC
HCT: 35.1 % — ABNORMAL LOW (ref 36.0–46.0)
Hemoglobin: 11.3 g/dL — ABNORMAL LOW (ref 12.0–15.0)
MCH: 31.6 pg (ref 26.0–34.0)
MCHC: 32.2 g/dL (ref 30.0–36.0)
MCV: 98 fL (ref 80.0–100.0)
Platelets: 306 10*3/uL (ref 150–400)
RBC: 3.58 MIL/uL — ABNORMAL LOW (ref 3.87–5.11)
RDW: 11.2 % — ABNORMAL LOW (ref 11.5–15.5)
WBC: 8.5 10*3/uL (ref 4.0–10.5)
nRBC: 0 % (ref 0.0–0.2)

## 2023-05-10 LAB — I-STAT CHEM 8, ED
BUN: 13 mg/dL (ref 6–20)
Calcium, Ion: 1.08 mmol/L — ABNORMAL LOW (ref 1.15–1.40)
Chloride: 107 mmol/L (ref 98–111)
Creatinine, Ser: 1.3 mg/dL — ABNORMAL HIGH (ref 0.44–1.00)
Glucose, Bld: 78 mg/dL (ref 70–99)
HCT: 35 % — ABNORMAL LOW (ref 36.0–46.0)
Hemoglobin: 11.9 g/dL — ABNORMAL LOW (ref 12.0–15.0)
Potassium: 2.8 mmol/L — ABNORMAL LOW (ref 3.5–5.1)
Sodium: 143 mmol/L (ref 135–145)
TCO2: 21 mmol/L — ABNORMAL LOW (ref 22–32)

## 2023-05-10 LAB — SAMPLE TO BLOOD BANK

## 2023-05-10 LAB — I-STAT CG4 LACTIC ACID, ED: Lactic Acid, Venous: 4.9 mmol/L (ref 0.5–1.9)

## 2023-05-10 LAB — PROTIME-INR
INR: 1 (ref 0.8–1.2)
Prothrombin Time: 12.9 seconds (ref 11.4–15.2)

## 2023-05-10 LAB — ETHANOL: Alcohol, Ethyl (B): 103 mg/dL — ABNORMAL HIGH (ref <10)

## 2023-05-10 SURGERY — LAPAROTOMY, EXPLORATORY
Anesthesia: General | Site: Abdomen

## 2023-05-10 MED ORDER — IOHEXOL 350 MG/ML SOLN
75.0000 mL | Freq: Once | INTRAVENOUS | Status: AC | PRN
  Administered 2023-05-10: 75 mL via INTRAVENOUS

## 2023-05-10 MED ORDER — SODIUM CHLORIDE 0.9 % IV SOLN
INTRAVENOUS | Status: AC | PRN
  Administered 2023-05-10: 1000 mL via INTRAVENOUS

## 2023-05-10 MED ORDER — 0.9 % SODIUM CHLORIDE (POUR BTL) OPTIME: TOPICAL | Status: DC | PRN

## 2023-05-10 MED ORDER — KETAMINE HCL 50 MG/5ML IJ SOSY
0.3000 mg/kg | PREFILLED_SYRINGE | Freq: Once | INTRAMUSCULAR | Status: AC
  Administered 2023-05-10: 15 mg via INTRAVENOUS

## 2023-05-10 MED ORDER — TETANUS-DIPHTH-ACELL PERTUSSIS 5-2.5-18.5 LF-MCG/0.5 IM SUSY
0.5000 mL | PREFILLED_SYRINGE | Freq: Once | INTRAMUSCULAR | Status: AC
  Administered 2023-05-10: 0.5 mL via INTRAMUSCULAR
  Filled 2023-05-10: qty 0.5

## 2023-05-10 MED ORDER — CEFAZOLIN SODIUM-DEXTROSE 2-4 GM/100ML-% IV SOLN
2.0000 g | Freq: Once | INTRAVENOUS | Status: AC
  Administered 2023-05-10: 2 g via INTRAVENOUS
  Filled 2023-05-10: qty 100

## 2023-05-10 MED ORDER — MIDAZOLAM HCL 2 MG/2ML IJ SOLN
INTRAMUSCULAR | Status: AC
  Filled 2023-05-10: qty 2

## 2023-05-10 MED ORDER — PROPOFOL 10 MG/ML IV BOLUS
INTRAVENOUS | Status: AC
  Filled 2023-05-10: qty 20

## 2023-05-10 MED ORDER — KETAMINE HCL 50 MG/5ML IJ SOSY
1.0000 mg/kg | PREFILLED_SYRINGE | Freq: Once | INTRAMUSCULAR | Status: DC
  Filled 2023-05-10: qty 5

## 2023-05-10 MED ORDER — FENTANYL CITRATE (PF) 250 MCG/5ML IJ SOLN
INTRAMUSCULAR | Status: AC
  Filled 2023-05-10: qty 5

## 2023-05-10 MED ORDER — SODIUM CHLORIDE 0.9% IV SOLUTION: Freq: Once | INTRAVENOUS | Status: DC

## 2023-05-10 NOTE — Progress Notes (Signed)
Orthopedic Tech Progress Note Patient Details:  Suzanne Goodwin 07/11/1996 [id number]  Patient ID: Suzanne Goodwin, female   DOB: April 01, 1996, 27 y.o.   MRN: [id number] I attended trauma page. Trinna Post 05/10/2023, 11:33 PM
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
## 2023-05-10 NOTE — ED Notes (Signed)
Dr. Andrey Campanile ( Trauma MD ) explained patient's status and tests results/plan of care to patient and family at bedside .

## 2023-05-10 NOTE — Anesthesia Preprocedure Evaluation (Signed)
Anesthesia Evaluation  Patient identified by MRN, date of birth, ID band Patient awake    Reviewed: Allergy & Precautions, H&P , NPO status , Patient's Chart, lab work & pertinent test results  Airway        Dental no notable dental hx.    Pulmonary neg pulmonary ROS   Pulmonary exam normal        Cardiovascular negative cardio ROS      Neuro/Psych   Anxiety     negative neurological ROS     GI/Hepatic negative GI ROS, Neg liver ROS,,,  Endo/Other  negative endocrine ROS    Renal/GU negative Renal ROS  negative genitourinary   Musculoskeletal   Abdominal   Peds  Hematology negative hematology ROS (+)   Anesthesia Other Findings   Reproductive/Obstetrics negative OB ROS                             Anesthesia Physical Anesthesia Plan  ASA: 2 and emergent  Anesthesia Plan: General   Post-op Pain Management: Ofirmev IV (intra-op)*   Induction: Intravenous, Rapid sequence and Cricoid pressure planned  PONV Risk Score and Plan: 4 or greater and Ondansetron, Dexamethasone and Midazolam  Airway Management Planned: Oral ETT  Additional Equipment: Arterial line  Intra-op Plan:   Post-operative Plan: Extubation in OR and Possible Post-op intubation/ventilation  Informed Consent: I have reviewed the patients History and Physical, chart, labs and discussed the procedure including the risks, benefits and alternatives for the proposed anesthesia with the patient or authorized representative who has indicated his/her understanding and acceptance.     Dental advisory given  Plan Discussed with: CRNA  Anesthesia Plan Comments:        Anesthesia Quick Evaluation
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
## 2023-05-10 NOTE — ED Notes (Signed)
Patient transported to CT scan.,

## 2023-05-11 ENCOUNTER — Other Ambulatory Visit: Payer: Self-pay

## 2023-05-11 ENCOUNTER — Inpatient Hospital Stay (HOSPITAL_COMMUNITY): Payer: Medicaid Other

## 2023-05-11 DIAGNOSIS — R0602 Shortness of breath: Secondary | ICD-10-CM | POA: Diagnosis present

## 2023-05-11 DIAGNOSIS — D62 Acute posthemorrhagic anemia: Secondary | ICD-10-CM | POA: Diagnosis present

## 2023-05-11 DIAGNOSIS — Z8249 Family history of ischemic heart disease and other diseases of the circulatory system: Secondary | ICD-10-CM | POA: Diagnosis not present

## 2023-05-11 DIAGNOSIS — Z825 Family history of asthma and other chronic lower respiratory diseases: Secondary | ICD-10-CM | POA: Diagnosis not present

## 2023-05-11 DIAGNOSIS — E876 Hypokalemia: Secondary | ICD-10-CM | POA: Diagnosis present

## 2023-05-11 DIAGNOSIS — S2242XA Multiple fractures of ribs, left side, initial encounter for closed fracture: Secondary | ICD-10-CM | POA: Diagnosis present

## 2023-05-11 DIAGNOSIS — N179 Acute kidney failure, unspecified: Secondary | ICD-10-CM | POA: Diagnosis present

## 2023-05-11 DIAGNOSIS — Z833 Family history of diabetes mellitus: Secondary | ICD-10-CM | POA: Diagnosis not present

## 2023-05-11 DIAGNOSIS — S21332A Puncture wound without foreign body of left front wall of thorax with penetration into thoracic cavity, initial encounter: Secondary | ICD-10-CM | POA: Diagnosis present

## 2023-05-11 DIAGNOSIS — F419 Anxiety disorder, unspecified: Secondary | ICD-10-CM | POA: Diagnosis present

## 2023-05-11 DIAGNOSIS — W3400XA Accidental discharge from unspecified firearms or gun, initial encounter: Secondary | ICD-10-CM | POA: Diagnosis not present

## 2023-05-11 DIAGNOSIS — S272XXA Traumatic hemopneumothorax, initial encounter: Secondary | ICD-10-CM | POA: Diagnosis present

## 2023-05-11 DIAGNOSIS — Z820 Family history of epilepsy and other diseases of the nervous system: Secondary | ICD-10-CM | POA: Diagnosis not present

## 2023-05-11 DIAGNOSIS — R109 Unspecified abdominal pain: Secondary | ICD-10-CM | POA: Diagnosis present

## 2023-05-11 DIAGNOSIS — Z87891 Personal history of nicotine dependence: Secondary | ICD-10-CM | POA: Diagnosis not present

## 2023-05-11 DIAGNOSIS — M549 Dorsalgia, unspecified: Secondary | ICD-10-CM | POA: Diagnosis present

## 2023-05-11 DIAGNOSIS — Z23 Encounter for immunization: Secondary | ICD-10-CM | POA: Diagnosis not present

## 2023-05-11 DIAGNOSIS — I959 Hypotension, unspecified: Secondary | ICD-10-CM | POA: Diagnosis present

## 2023-05-11 DIAGNOSIS — J90 Pleural effusion, not elsewhere classified: Secondary | ICD-10-CM | POA: Diagnosis present

## 2023-05-11 DIAGNOSIS — T797XXA Traumatic subcutaneous emphysema, initial encounter: Secondary | ICD-10-CM | POA: Diagnosis present

## 2023-05-11 DIAGNOSIS — R110 Nausea: Secondary | ICD-10-CM | POA: Diagnosis not present

## 2023-05-11 DIAGNOSIS — S27321A Contusion of lung, unilateral, initial encounter: Secondary | ICD-10-CM | POA: Diagnosis present

## 2023-05-11 DIAGNOSIS — Z9109 Other allergy status, other than to drugs and biological substances: Secondary | ICD-10-CM | POA: Diagnosis not present

## 2023-05-11 LAB — BASIC METABOLIC PANEL
Anion gap: 12 (ref 5–15)
BUN: 10 mg/dL (ref 6–20)
CO2: 20 mmol/L — ABNORMAL LOW (ref 22–32)
Calcium: 7.8 mg/dL — ABNORMAL LOW (ref 8.9–10.3)
Chloride: 105 mmol/L (ref 98–111)
Creatinine, Ser: 0.9 mg/dL (ref 0.44–1.00)
GFR, Estimated: >60 mL/min (ref >60)
Glucose, Bld: 100 mg/dL — ABNORMAL HIGH (ref 70–99)
Potassium: 3.3 mmol/L — ABNORMAL LOW (ref 3.5–5.1)
Sodium: 137 mmol/L (ref 135–145)

## 2023-05-11 LAB — TYPE AND SCREEN
ABO/RH(D): B POS
Antibody Screen: NEGATIVE

## 2023-05-11 LAB — MAGNESIUM: Magnesium: 1.8 mg/dL (ref 1.7–2.4)

## 2023-05-11 LAB — HIV ANTIBODY (ROUTINE TESTING W REFLEX): HIV Screen 4th Generation wRfx: NONREACTIVE

## 2023-05-11 LAB — CBC
HCT: 36.4 % (ref 36.0–46.0)
Hemoglobin: 12.9 g/dL (ref 12.0–15.0)
MCH: 32.5 pg (ref 26.0–34.0)
MCHC: 35.4 g/dL (ref 30.0–36.0)
MCV: 91.7 fL (ref 80.0–100.0)
Platelets: 244 10*3/uL (ref 150–400)
RBC: 3.97 MIL/uL (ref 3.87–5.11)
RDW: 13.5 % (ref 11.5–15.5)
WBC: 16.2 10*3/uL — ABNORMAL HIGH (ref 4.0–10.5)
nRBC: 0 % (ref 0.0–0.2)

## 2023-05-11 LAB — PREPARE RBC (CROSSMATCH)

## 2023-05-11 MED ORDER — ONDANSETRON 4 MG PO TBDP: 4.0000 mg | ORAL_TABLET | Freq: Four times a day (QID) | ORAL | Status: DC | PRN

## 2023-05-11 MED ORDER — ONDANSETRON HCL 4 MG/2ML IJ SOLN
4.0000 mg | Freq: Four times a day (QID) | INTRAMUSCULAR | Status: DC | PRN
  Administered 2023-05-11 – 2023-05-14 (×5): 4 mg via INTRAVENOUS
  Filled 2023-05-11 (×5): qty 2

## 2023-05-11 MED ORDER — METHOCARBAMOL 500 MG PO TABS
500.0000 mg | ORAL_TABLET | Freq: Three times a day (TID) | ORAL | Status: DC
  Administered 2023-05-11 – 2023-05-12 (×4): 500 mg via ORAL
  Filled 2023-05-11 (×6): qty 1

## 2023-05-11 MED ORDER — HYDRALAZINE HCL 20 MG/ML IJ SOLN: 10.0000 mg | INTRAMUSCULAR | Status: DC | PRN

## 2023-05-11 MED ORDER — HYDROMORPHONE HCL 1 MG/ML IJ SOLN
1.0000 mg | INTRAMUSCULAR | Status: DC | PRN
  Administered 2023-05-11 – 2023-05-13 (×5): 1 mg via INTRAVENOUS
  Filled 2023-05-11 (×5): qty 1

## 2023-05-11 MED ORDER — SODIUM CHLORIDE 0.9 % IV SOLN: INTRAVENOUS | Status: DC

## 2023-05-11 MED ORDER — OXYCODONE HCL 5 MG PO TABS
10.0000 mg | ORAL_TABLET | ORAL | Status: DC | PRN
  Administered 2023-05-11 – 2023-05-12 (×6): 10 mg via ORAL
  Filled 2023-05-11 (×7): qty 2

## 2023-05-11 MED ORDER — METHOCARBAMOL 1000 MG/10ML IJ SOLN
500.0000 mg | Freq: Three times a day (TID) | INTRAVENOUS | Status: DC
  Administered 2023-05-11: 500 mg via INTRAVENOUS
  Filled 2023-05-11: qty 500

## 2023-05-11 MED ORDER — ACETAMINOPHEN 500 MG PO TABS
1000.0000 mg | ORAL_TABLET | Freq: Four times a day (QID) | ORAL | Status: DC
  Administered 2023-05-11 – 2023-05-14 (×10): 1000 mg via ORAL
  Filled 2023-05-11 (×13): qty 2

## 2023-05-11 MED ORDER — POLYETHYLENE GLYCOL 3350 17 G PO PACK: 17.0000 g | PACK | Freq: Every day | ORAL | Status: DC | PRN

## 2023-05-11 MED ORDER — ENOXAPARIN SODIUM 30 MG/0.3ML IJ SOSY
30.0000 mg | PREFILLED_SYRINGE | Freq: Two times a day (BID) | INTRAMUSCULAR | Status: DC
  Administered 2023-05-12 – 2023-05-14 (×5): 30 mg via SUBCUTANEOUS
  Filled 2023-05-11 (×5): qty 0.3

## 2023-05-11 MED ORDER — DOCUSATE SODIUM 100 MG PO CAPS
100.0000 mg | ORAL_CAPSULE | Freq: Two times a day (BID) | ORAL | Status: DC
  Administered 2023-05-11 – 2023-05-14 (×7): 100 mg via ORAL
  Filled 2023-05-11 (×8): qty 1

## 2023-05-11 MED ORDER — MORPHINE SULFATE (PF) 2 MG/ML IV SOLN
1.0000 mg | INTRAVENOUS | Status: DC | PRN
  Administered 2023-05-11: 2 mg via INTRAVENOUS
  Filled 2023-05-11: qty 1

## 2023-05-11 MED ORDER — OXYCODONE HCL 5 MG PO TABS: 5.0000 mg | ORAL_TABLET | ORAL | Status: DC | PRN

## 2023-05-11 MED ORDER — METOPROLOL TARTRATE 5 MG/5ML IV SOLN: 5.0000 mg | Freq: Four times a day (QID) | INTRAVENOUS | Status: DC | PRN

## 2023-05-11 MED ORDER — SODIUM CHLORIDE 0.9% IV SOLUTION: Freq: Once | INTRAVENOUS | Status: DC

## 2023-05-11 MED ORDER — POTASSIUM CHLORIDE 10 MEQ/100ML IV SOLN
10.0000 meq | INTRAVENOUS | Status: AC
  Administered 2023-05-11 (×2): 10 meq via INTRAVENOUS
  Filled 2023-05-11 (×2): qty 100

## 2023-05-11 MED ORDER — DIPHENHYDRAMINE HCL 25 MG PO CAPS
25.0000 mg | ORAL_CAPSULE | ORAL | Status: DC | PRN
  Administered 2023-05-11 – 2023-05-13 (×5): 25 mg via ORAL
  Filled 2023-05-11 (×5): qty 1
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
## 2023-05-11 NOTE — Plan of Care (Signed)
  Problem: Clinical Measurements: Goal: Will remain free from infection Outcome: Progressing   Problem: Education: Goal: Knowledge of General Education information will improve Description: Including pain rating scale, medication(s)/side effects and non-pharmacologic comfort measures Outcome: Progressing   Problem: Health Behavior/Discharge Planning: Goal: Ability to manage health-related needs will improve Outcome: Progressing   Problem: Clinical Measurements: Goal: Ability to maintain clinical measurements within normal limits will improve Outcome: Progressing Goal: Will remain free from infection Outcome: Progressing Goal: Diagnostic test results will improve Outcome: Progressing Goal: Respiratory complications will improve Outcome: Progressing Goal: Cardiovascular complication will be avoided Outcome: Progressing   Problem: Activity: Goal: Risk for activity intolerance will decrease Outcome: Progressing   Problem: Nutrition: Goal: Adequate nutrition will be maintained Outcome: Progressing   Problem: Coping: Goal: Level of anxiety will decrease Outcome: Progressing   Problem: Elimination: Goal: Will not experience complications related to bowel motility Outcome: Progressing Goal: Will not experience complications related to urinary retention Outcome: Progressing   Problem: Pain Managment: Goal: General experience of comfort will improve Outcome: Progressing   Problem: Safety: Goal: Ability to remain free from injury will improve Outcome: Progressing   Problem: Skin Integrity: Goal: Risk for impaired skin integrity will decrease Outcome: Progressing
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
## 2023-05-11 NOTE — TOC CAGE-AID Note (Signed)
Transition of Care [facility]) - CAGE-AID Screening   Patient Details  Name: Suzanne Goodwin MRN: [id number] Date of Birth: 17-Jan-1996  [facility] of [facility]) CM/SW Contact:    Leota Sauers, RN Phone Number: 05/11/2023, 5:50 AM   Clinical Narrative:  Patient denies alcohol use, reports occasional marijuana use. Denies need for resources at this time.  CAGE-AID Screening:    Have You Ever Felt You Ought to Cut Down on Your Drinking or Drug Use?: No Have People Annoyed You By Critizing Your Drinking Or Drug Use?: No Have You Felt Bad Or Guilty About Your Drinking Or Drug Use?: No Have You Ever Had a Drink or Used Drugs First Thing In The Morning to Steady Your Nerves or to Get Rid of a Hangover?: No CAGE-AID Score: 0  Substance Abuse Education Offered: No
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
## 2023-05-12 ENCOUNTER — Inpatient Hospital Stay (HOSPITAL_COMMUNITY): Payer: Medicaid Other

## 2023-05-12 MED ORDER — METHOCARBAMOL 1000 MG/10ML IJ SOLN
500.0000 mg | Freq: Four times a day (QID) | INTRAVENOUS | Status: DC
  Filled 2023-05-12: qty 5

## 2023-05-12 MED ORDER — METHOCARBAMOL 500 MG PO TABS
500.0000 mg | ORAL_TABLET | Freq: Four times a day (QID) | ORAL | Status: DC
  Administered 2023-05-12 – 2023-05-14 (×9): 500 mg via ORAL
  Filled 2023-05-12 (×9): qty 1

## 2023-05-12 MED ORDER — POLYETHYLENE GLYCOL 3350 17 G PO PACK
17.0000 g | PACK | Freq: Every day | ORAL | Status: DC
  Administered 2023-05-12 – 2023-05-14 (×3): 17 g via ORAL
  Filled 2023-05-12 (×3): qty 1
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
## 2023-05-13 ENCOUNTER — Inpatient Hospital Stay (HOSPITAL_COMMUNITY): Payer: Medicaid Other

## 2023-05-13 ENCOUNTER — Inpatient Hospital Stay (HOSPITAL_COMMUNITY): Payer: MEDICAID

## 2023-05-13 MED ORDER — TRAMADOL HCL 50 MG PO TABS: 50.0000 mg | ORAL_TABLET | Freq: Four times a day (QID) | ORAL | Status: DC | PRN

## 2023-05-13 MED ORDER — IBUPROFEN 200 MG PO TABS
600.0000 mg | ORAL_TABLET | Freq: Four times a day (QID) | ORAL | Status: DC
  Administered 2023-05-13 – 2023-05-14 (×5): 600 mg via ORAL
  Filled 2023-05-13 (×5): qty 3

## 2023-05-13 MED ORDER — TRAMADOL HCL 50 MG PO TABS
100.0000 mg | ORAL_TABLET | Freq: Four times a day (QID) | ORAL | Status: DC | PRN
  Administered 2023-05-13 – 2023-05-14 (×3): 100 mg via ORAL
  Filled 2023-05-13 (×3): qty 2

## 2023-05-13 MED ORDER — HYDROMORPHONE HCL 1 MG/ML IJ SOLN: 1.0000 mg | Freq: Four times a day (QID) | INTRAMUSCULAR | Status: DC | PRN
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
## 2023-05-13 NOTE — Progress Notes (Signed)
Pt has intense itching after taking Oxy, despite administering benadryl, pt requesting new PRN for pain, is comfortable at the moment  with scheduled tylenol and robaxin   Will pass along to dayshift RN

## 2023-05-14 ENCOUNTER — Inpatient Hospital Stay (HOSPITAL_COMMUNITY): Payer: Medicaid Other

## 2023-05-14 ENCOUNTER — Other Ambulatory Visit (HOSPITAL_COMMUNITY): Payer: Self-pay

## 2023-05-14 MED ORDER — IBUPROFEN 200 MG PO TABS: 600.0000 mg | ORAL_TABLET | Freq: Four times a day (QID) | ORAL | Status: AC | PRN

## 2023-05-14 MED ORDER — ACETAMINOPHEN 500 MG PO TABS: 1000.0000 mg | ORAL_TABLET | Freq: Four times a day (QID) | ORAL | Status: AC | PRN

## 2023-05-14 MED ORDER — POLYETHYLENE GLYCOL 3350 17 G PO PACK: 17.0000 g | PACK | Freq: Every day | ORAL | Status: AC | PRN

## 2023-05-14 MED ORDER — METHOCARBAMOL 500 MG PO TABS
500.0000 mg | ORAL_TABLET | Freq: Four times a day (QID) | ORAL | 0 refills | Status: AC | PRN
  Filled 2023-05-14: qty 30, 8d supply, fill #0

## 2023-05-14 MED ORDER — TRAMADOL HCL 50 MG PO TABS
50.0000 mg | ORAL_TABLET | Freq: Four times a day (QID) | ORAL | 0 refills | Status: AC | PRN
  Filled 2023-05-14: qty 20, 3d supply, fill #0
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
## 2023-05-15 ENCOUNTER — Other Ambulatory Visit: Payer: Self-pay

## 2023-05-15 ENCOUNTER — Emergency Department (HOSPITAL_COMMUNITY)
Admission: EM | Admit: 2023-05-15 | Discharge: 2023-05-15 | Disposition: A | Discharge status: Home / Self Care | Payer: Self-pay | Source: Home / Self Care | Attending: Emergency Medicine | Admitting: Emergency Medicine

## 2023-05-15 ENCOUNTER — Encounter (HOSPITAL_COMMUNITY): Payer: Self-pay | Admitting: Emergency Medicine

## 2023-05-15 ENCOUNTER — Emergency Department (HOSPITAL_COMMUNITY): Payer: Medicaid Other

## 2023-05-15 DIAGNOSIS — R110 Nausea: Secondary | ICD-10-CM | POA: Diagnosis not present

## 2023-05-15 LAB — CBC WITH DIFFERENTIAL/PLATELET
Abs Immature Granulocytes: 0.03 10*3/uL (ref 0.00–0.07)
Basophils Absolute: 0 10*3/uL (ref 0.0–0.1)
Basophils Relative: 0 %
Eosinophils Absolute: 0 10*3/uL (ref 0.0–0.5)
Eosinophils Relative: 1 %
HCT: 31.3 % — ABNORMAL LOW (ref 36.0–46.0)
Hemoglobin: 10.4 g/dL — ABNORMAL LOW (ref 12.0–15.0)
Immature Granulocytes: 0 %
Lymphocytes Relative: 10 %
Lymphs Abs: 0.9 10*3/uL (ref 0.7–4.0)
MCH: 30.9 pg (ref 26.0–34.0)
MCHC: 33.2 g/dL (ref 30.0–36.0)
MCV: 92.9 fL (ref 80.0–100.0)
Monocytes Absolute: 0.6 10*3/uL (ref 0.1–1.0)
Monocytes Relative: 7 %
Neutro Abs: 6.9 10*3/uL (ref 1.7–7.7)
Neutrophils Relative %: 82 %
Platelets: 290 10*3/uL (ref 150–400)
RBC: 3.37 MIL/uL — ABNORMAL LOW (ref 3.87–5.11)
RDW: 13.2 % (ref 11.5–15.5)
WBC: 8.4 10*3/uL (ref 4.0–10.5)
nRBC: 0 % (ref 0.0–0.2)

## 2023-05-15 LAB — COMPREHENSIVE METABOLIC PANEL
ALT: 23 U/L (ref 0–44)
AST: 42 U/L — ABNORMAL HIGH (ref 15–41)
Albumin: 3.1 g/dL — ABNORMAL LOW (ref 3.5–5.0)
Alkaline Phosphatase: 59 U/L (ref 38–126)
Anion gap: 13 (ref 5–15)
BUN: 5 mg/dL — ABNORMAL LOW (ref 6–20)
CO2: 25 mmol/L (ref 22–32)
Calcium: 8.8 mg/dL — ABNORMAL LOW (ref 8.9–10.3)
Chloride: 98 mmol/L (ref 98–111)
Creatinine, Ser: 0.82 mg/dL (ref 0.44–1.00)
GFR, Estimated: >60 mL/min (ref >60)
Glucose, Bld: 99 mg/dL (ref 70–99)
Potassium: 3.7 mmol/L (ref 3.5–5.1)
Sodium: 136 mmol/L (ref 135–145)
Total Bilirubin: 0.6 mg/dL (ref 0.3–1.2)
Total Protein: 5.9 g/dL — ABNORMAL LOW (ref 6.5–8.1)

## 2023-05-15 LAB — HCG, QUANTITATIVE, PREGNANCY: hCG, Beta Chain, Quant, S: 1 m[IU]/mL (ref <5)

## 2023-05-15 MED ORDER — ONDANSETRON 4 MG PO TBDP
8.0000 mg | ORAL_TABLET | Freq: Once | ORAL | Status: AC
  Administered 2023-05-15: 8 mg via ORAL
  Filled 2023-05-15: qty 2

## 2023-05-15 MED ORDER — ONDANSETRON 4 MG PO TBDP: 4.0000 mg | ORAL_TABLET | Freq: Three times a day (TID) | ORAL | 0 refills | Status: AC | PRN

## 2023-05-15 MED ORDER — ONDANSETRON 4 MG PO TBDP: 4.0000 mg | ORAL_TABLET | Freq: Four times a day (QID) | ORAL | Status: DC | PRN

## 2023-05-15 MED ORDER — BISACODYL 10 MG RE SUPP
10.0000 mg | Freq: Once | RECTAL | Status: AC
  Administered 2023-05-15: 10 mg via RECTAL
  Filled 2023-05-15: qty 1

## 2023-05-15 MED ORDER — IOHEXOL 350 MG/ML SOLN
75.0000 mL | Freq: Once | INTRAVENOUS | Status: AC | PRN
  Administered 2023-05-15: 75 mL via INTRAVENOUS

## 2023-05-15 MED ORDER — PROCHLORPERAZINE EDISYLATE 10 MG/2ML IJ SOLN
10.0000 mg | Freq: Four times a day (QID) | INTRAMUSCULAR | Status: DC | PRN
  Administered 2023-05-15: 10 mg via INTRAVENOUS
  Filled 2023-05-15: qty 2

## 2023-05-15 MED ORDER — ACETAMINOPHEN 500 MG PO TABS
1000.0000 mg | ORAL_TABLET | Freq: Four times a day (QID) | ORAL | Status: DC
  Administered 2023-05-15: 1000 mg via ORAL
  Filled 2023-05-15: qty 2

## 2023-05-15 MED ORDER — CEFDINIR 300 MG PO CAPS: 300.0000 mg | ORAL_CAPSULE | Freq: Two times a day (BID) | ORAL | 0 refills | Status: AC

## 2023-05-15 MED ORDER — ONDANSETRON HCL 4 MG/2ML IJ SOLN
4.0000 mg | Freq: Once | INTRAMUSCULAR | Status: AC
  Administered 2023-05-15: 4 mg via INTRAVENOUS
  Filled 2023-05-15: qty 2

## 2023-05-15 MED ORDER — METHOCARBAMOL 500 MG PO TABS
500.0000 mg | ORAL_TABLET | Freq: Four times a day (QID) | ORAL | Status: DC
  Administered 2023-05-15: 500 mg via ORAL
  Filled 2023-05-15: qty 1

## 2023-05-15 MED ORDER — IOHEXOL 9 MG/ML PO SOLN
500.0000 mL | ORAL | Status: AC
  Administered 2023-05-15: 500 mL via ORAL

## 2023-05-15 MED ORDER — TRAMADOL HCL 50 MG PO TABS
50.0000 mg | ORAL_TABLET | Freq: Four times a day (QID) | ORAL | Status: DC | PRN
  Filled 2023-05-15: qty 1

## 2023-05-15 MED ORDER — FENTANYL CITRATE PF 50 MCG/ML IJ SOSY
50.0000 ug | PREFILLED_SYRINGE | Freq: Once | INTRAMUSCULAR | Status: AC
  Administered 2023-05-15: 50 ug via INTRAVENOUS
  Filled 2023-05-15: qty 1

## 2023-05-15 MED ORDER — SODIUM CHLORIDE 0.9 % IV BOLUS (SEPSIS)
1000.0000 mL | Freq: Once | INTRAVENOUS | Status: AC
  Administered 2023-05-15: 1000 mL via INTRAVENOUS

## 2023-05-15 NOTE — Discharge Instructions (Addendum)
Your CT scan shows possible early pneumonia.  You are being sent home on antibiotics with some nausea medicine.  You should call your primary care doctor today to schedule close follow-up.  If you develop severe pain, difficulty breathing, persistent vomiting or any other new concerning symptoms you can return to the ED.
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
## 2023-05-15 NOTE — TOC CAGE-AID Note (Signed)
Transition of Care [facility]) - CAGE-AID Screening   Patient Details  Name: Delisa Kendzierski MRN: [id number] Date of Birth: 1996/06/14  [facility] CM/SW Contact:    Hewitt Shorts, RN Phone Number: 05/15/2023, 9:10 AM   Clinical Narrative:  Pt was discharged yesterday- returned for Increased nausea and vomiting. Denies alcohol use, does use marijuana occasionally. No services required.   CAGE-AID Screening:    Have You Ever Felt You Ought to Cut Down on Your Drinking or Drug Use?: No Have People Annoyed You By Critizing Your Drinking Or Drug Use?: No Have You Felt Bad Or Guilty About Your Drinking Or Drug Use?: No Have You Ever Had a Drink or Used Drugs First Thing In The Morning to Steady Your Nerves or to Get Rid of a Hangover?: No CAGE-AID Score: 0  Substance Abuse Education Offered: No
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
## 2023-05-15 NOTE — ED Notes (Signed)
Pt is tolerating PO intake
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
## 2023-05-15 NOTE — ED Notes (Signed)
DC instructions reviewed with pt. Pt verbalized understanding.  Pt DC 

## 2023-05-15 NOTE — ED Notes (Signed)
Pt complaining of nausea still after multiple rounds of zofran.  Also 7/10 pain as long as she is not moving.

## 2023-05-15 NOTE — ED Notes (Signed)
Wheeled patient to the bathroom patient did well patient isnow back in bed on the monitor with family at bedside

## 2023-05-17 ENCOUNTER — Encounter (HOSPITAL_COMMUNITY): Payer: Self-pay

## 2023-05-17 ENCOUNTER — Emergency Department (HOSPITAL_COMMUNITY): Payer: Medicaid Other

## 2023-05-17 ENCOUNTER — Telehealth: Payer: Self-pay | Admitting: Surgery

## 2023-05-17 ENCOUNTER — Other Ambulatory Visit: Payer: Self-pay

## 2023-05-17 ENCOUNTER — Emergency Department (HOSPITAL_COMMUNITY)
Admission: EM | Admit: 2023-05-17 | Discharge: 2023-05-18 | Disposition: A | Discharge status: Home / Self Care | Payer: Medicaid Other | Attending: Emergency Medicine | Admitting: Emergency Medicine

## 2023-05-17 DIAGNOSIS — M940 Chondrocostal junction syndrome [Tietze]: Secondary | ICD-10-CM | POA: Diagnosis not present

## 2023-05-17 DIAGNOSIS — R0602 Shortness of breath: Secondary | ICD-10-CM | POA: Diagnosis not present

## 2023-05-17 DIAGNOSIS — S2232XD Fracture of one rib, left side, subsequent encounter for fracture with routine healing: Secondary | ICD-10-CM | POA: Insufficient documentation

## 2023-05-17 DIAGNOSIS — S29001D Unspecified injury of muscle and tendon of front wall of thorax, subsequent encounter: Secondary | ICD-10-CM | POA: Diagnosis present

## 2023-05-17 DIAGNOSIS — W3400XD Accidental discharge from unspecified firearms or gun, subsequent encounter: Secondary | ICD-10-CM | POA: Insufficient documentation

## 2023-05-17 DIAGNOSIS — S27321D Contusion of lung, unilateral, subsequent encounter: Secondary | ICD-10-CM | POA: Diagnosis not present

## 2023-05-17 DIAGNOSIS — W3400XA Accidental discharge from unspecified firearms or gun, initial encounter: Secondary | ICD-10-CM

## 2023-05-17 LAB — BASIC METABOLIC PANEL
Anion gap: 10 (ref 5–15)
BUN: <5 mg/dL — ABNORMAL LOW (ref 6–20)
CO2: 27 mmol/L (ref 22–32)
Calcium: 9 mg/dL (ref 8.9–10.3)
Chloride: 99 mmol/L (ref 98–111)
Creatinine, Ser: 0.75 mg/dL (ref 0.44–1.00)
GFR, Estimated: >60 mL/min (ref >60)
Glucose, Bld: 97 mg/dL (ref 70–99)
Potassium: 3.8 mmol/L (ref 3.5–5.1)
Sodium: 136 mmol/L (ref 135–145)

## 2023-05-17 LAB — CBC
HCT: 31.5 % — ABNORMAL LOW (ref 36.0–46.0)
Hemoglobin: 10.4 g/dL — ABNORMAL LOW (ref 12.0–15.0)
MCH: 31.2 pg (ref 26.0–34.0)
MCHC: 33 g/dL (ref 30.0–36.0)
MCV: 94.6 fL (ref 80.0–100.0)
Platelets: 344 10*3/uL (ref 150–400)
RBC: 3.33 MIL/uL — ABNORMAL LOW (ref 3.87–5.11)
RDW: 13.3 % (ref 11.5–15.5)
WBC: 7.5 10*3/uL (ref 4.0–10.5)
nRBC: 0 % (ref 0.0–0.2)

## 2023-05-17 LAB — TROPONIN I (HIGH SENSITIVITY)
Troponin I (High Sensitivity): 4 ng/L (ref <18)
Troponin I (High Sensitivity): 5 ng/L (ref <18)

## 2023-05-17 LAB — HCG, SERUM, QUALITATIVE: Preg, Serum: NEGATIVE

## 2023-05-17 LAB — D-DIMER, QUANTITATIVE: D-Dimer, Quant: 7.41 ug/mL-FEU — ABNORMAL HIGH (ref 0.00–0.50)

## 2023-05-17 MED ORDER — HYDROCODONE-ACETAMINOPHEN 5-325 MG PO TABS: 1.0000 | ORAL_TABLET | Freq: Four times a day (QID) | ORAL | 0 refills | Status: AC | PRN

## 2023-05-17 MED ORDER — IOHEXOL 350 MG/ML SOLN
75.0000 mL | Freq: Once | INTRAVENOUS | Status: AC | PRN
  Administered 2023-05-17: 75 mL via INTRAVENOUS

## 2023-05-17 MED ORDER — ONDANSETRON HCL 4 MG/2ML IJ SOLN
4.0000 mg | Freq: Once | INTRAMUSCULAR | Status: AC
  Administered 2023-05-17: 4 mg via INTRAVENOUS
  Filled 2023-05-17: qty 2

## 2023-05-17 MED ORDER — MORPHINE SULFATE (PF) 4 MG/ML IV SOLN
4.0000 mg | Freq: Once | INTRAVENOUS | Status: AC
  Administered 2023-05-17: 4 mg via INTRAVENOUS
  Filled 2023-05-17: qty 1

## 2023-05-17 NOTE — ED Triage Notes (Signed)
Pt c/o midsternal chest pain and SOB started yesterday. Pt had chest tube removed on Thurs. Pt able to speak in complete sentences. Pt is tearful in triage. Pt state she has been using the incentive spirometer, but she can't take deep breaths.

## 2023-05-17 NOTE — ED Notes (Signed)
Pt refusing labs and IV at this time, would like to speak to MD
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
## 2023-07-01 ENCOUNTER — Other Ambulatory Visit (HOSPITAL_COMMUNITY): Payer: Self-pay
